# Patient Record
Sex: Male | Born: 1970 | Race: Black or African American | Hispanic: No | Marital: Single
Health system: Southern US, Community
[De-identification: ages and names within clinical notes are randomized; demographics above are authoritative.]

## PROBLEM LIST (undated history)

## (undated) ENCOUNTER — Emergency Department (HOSPITAL_COMMUNITY): Admission: EM | Payer: Managed Care, Other (non HMO)

## (undated) DIAGNOSIS — J45909 Unspecified asthma, uncomplicated: Secondary | ICD-10-CM

## (undated) DIAGNOSIS — I10 Essential (primary) hypertension: Secondary | ICD-10-CM

## (undated) DIAGNOSIS — J939 Pneumothorax, unspecified: Secondary | ICD-10-CM

## (undated) HISTORY — DX: Pneumothorax, unspecified: J93.9

## (undated) HISTORY — PX: WISDOM TOOTH EXTRACTION: SHX21

## (undated) HISTORY — PX: OTHER SURGICAL HISTORY: SHX169

## (undated) HISTORY — DX: Essential (primary) hypertension: I10

## (undated) HISTORY — DX: Unspecified asthma, uncomplicated: J45.909

---

## 2006-12-22 ENCOUNTER — Emergency Department (HOSPITAL_COMMUNITY): Admission: EM | Admit: 2006-12-22 | Discharge: 2006-12-22 | Payer: Self-pay | Admitting: Emergency Medicine

## 2008-04-16 IMAGING — CR DG HAND COMPLETE 3+V*R*
3 series · 3 of 3 positions shown · non-contrast
Comparison: none

CLINICAL DATA: Knuckle laceration. 
RIGHT HAND ? 3 VIEW:

[x hand pa right]
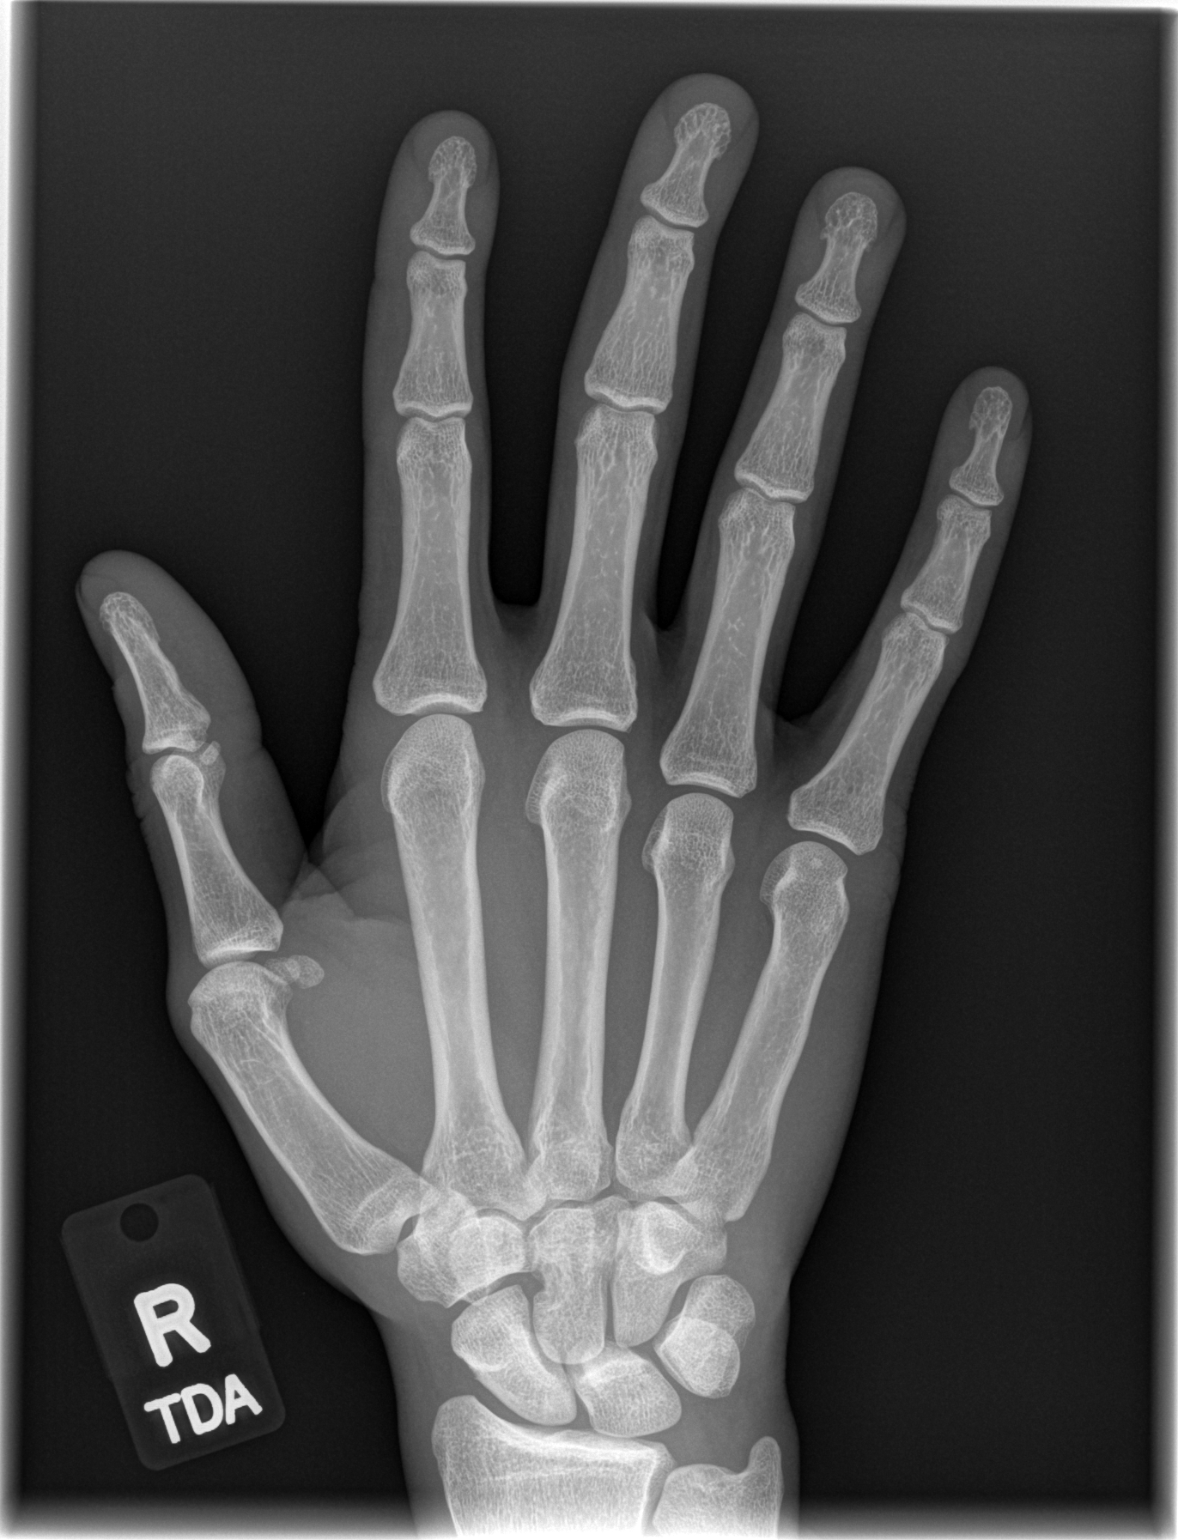

[x hand oblique right]
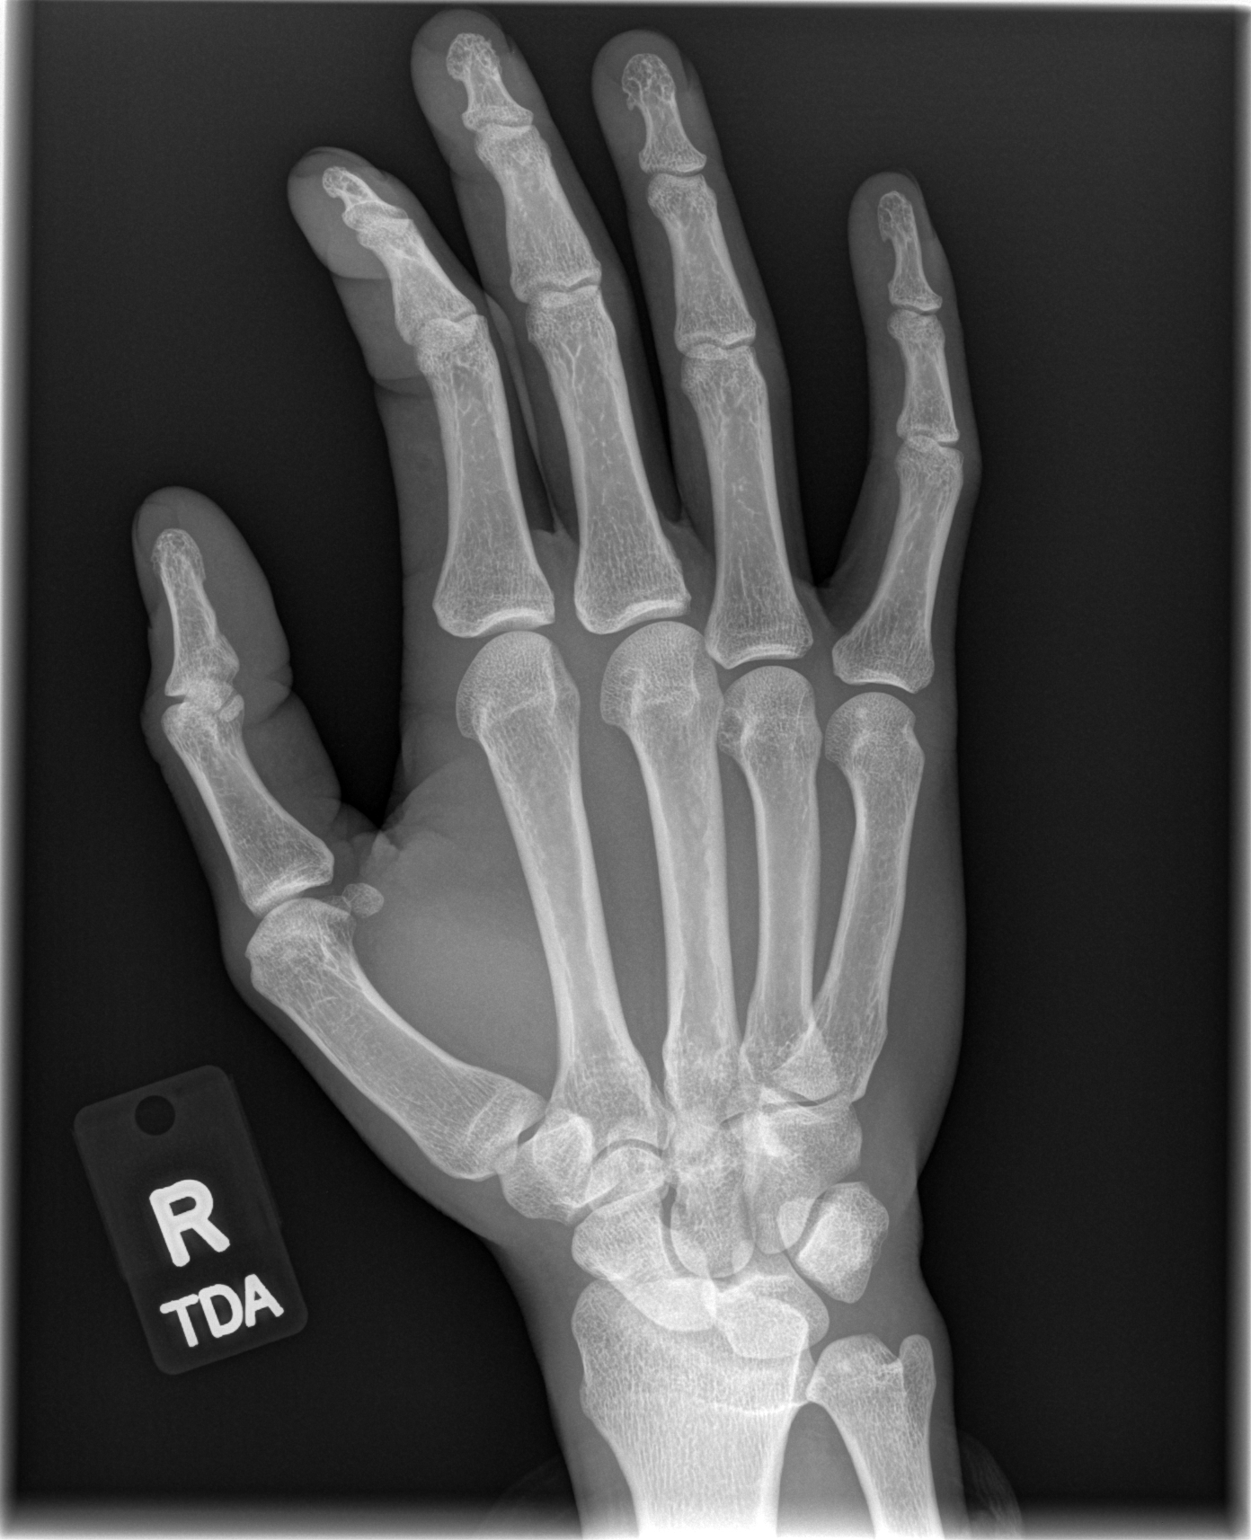

[x hand lat right]
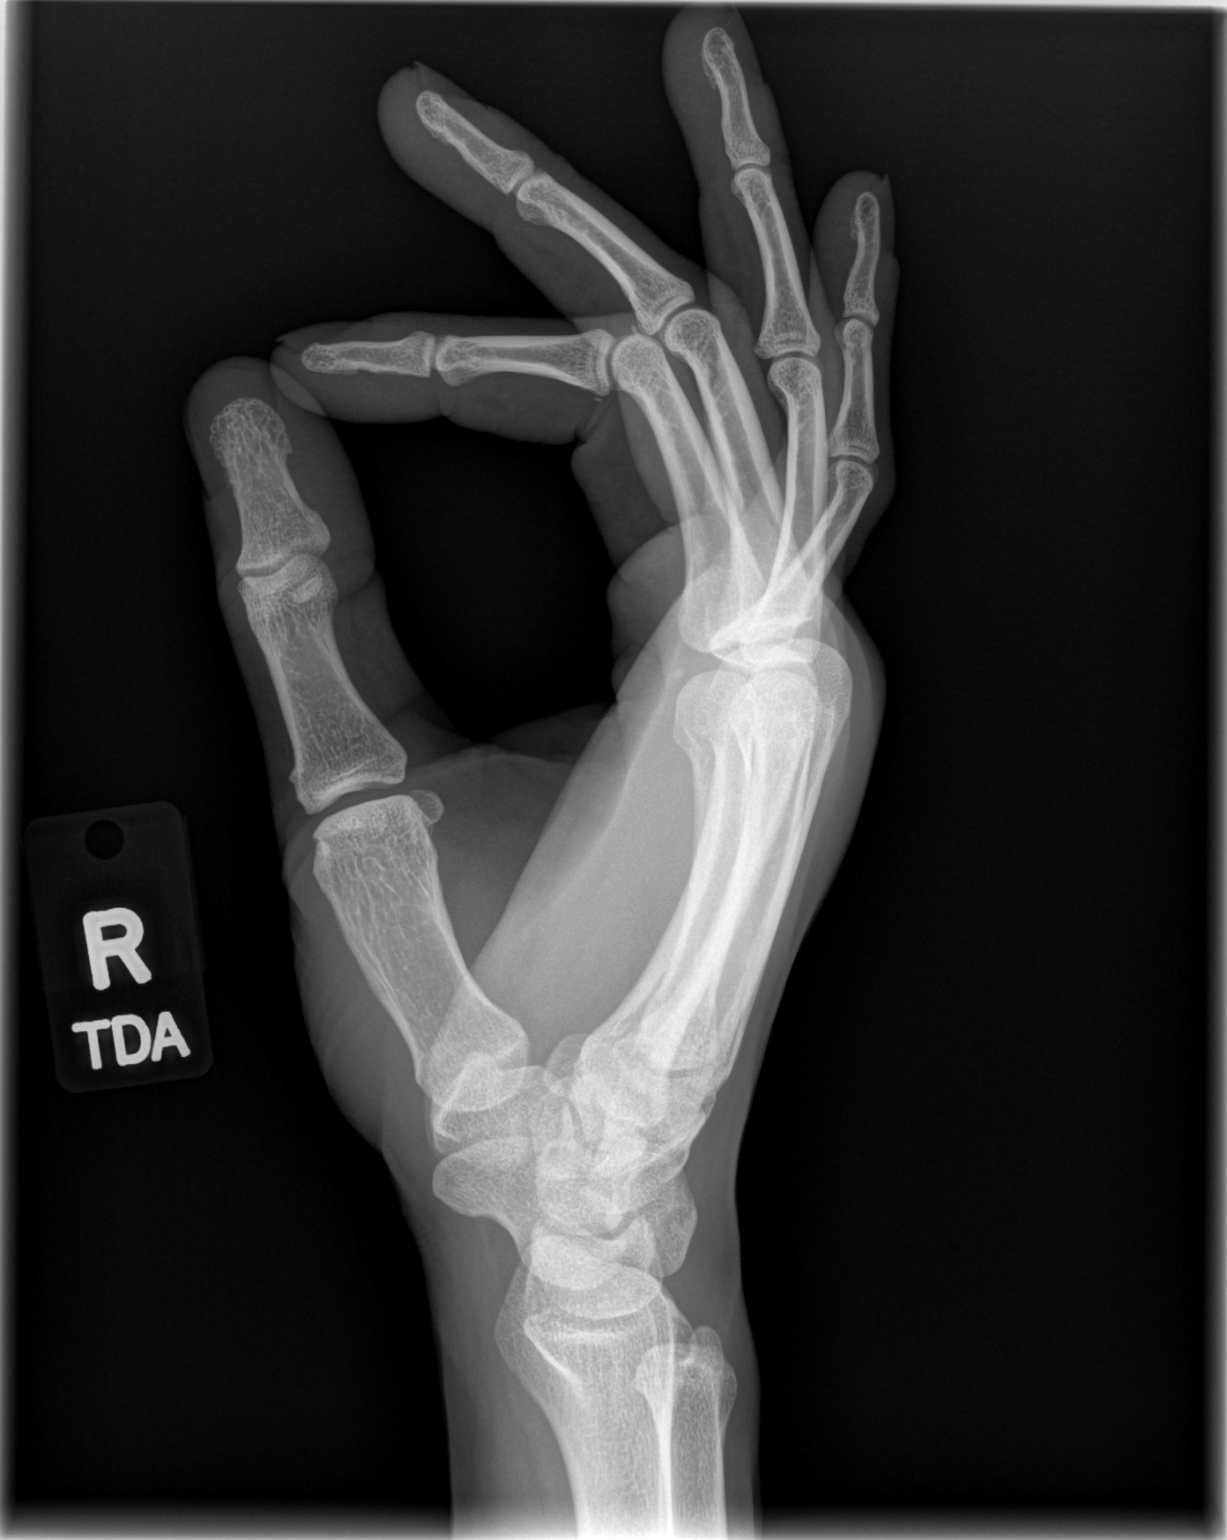

[3 of 3 positions shown; findings below may reference images not displayed]

FINDINGS: Standard 3 views of the right hand were obtained and 2 additional lateral views of the right index finger were obtained.  On the lateral view of the hand, there are small ossific densities along the volar aspect of middle phalanx involving the index and middle fingers.  These are probably small avulsion injuries and these may be chronic in nature.  There is no evidence to suggest acute fracture or dislocation in the area of concern which is the MCP joints.  Carpal bones appear intact.
IMPRESSION: 1.  No acute bone abnormalities of the right hand. 
2.  Suspect avulsion injuries involving the index finger and middle finger PIP joints.  These are age indeterminate.

## 2010-05-16 ENCOUNTER — Inpatient Hospital Stay (INDEPENDENT_AMBULATORY_CARE_PROVIDER_SITE_OTHER)
Admission: RE | Admit: 2010-05-16 | Discharge: 2010-05-16 | Disposition: A | Discharge status: Home / Self Care | Payer: Self-pay | Source: Ambulatory Visit | Attending: Emergency Medicine | Admitting: Emergency Medicine

## 2010-05-16 DIAGNOSIS — B9789 Other viral agents as the cause of diseases classified elsewhere: Secondary | ICD-10-CM

## 2010-05-16 LAB — POCT RAPID STREP A (OFFICE): Streptococcus, Group A Screen (Direct): NEGATIVE

## 2012-07-12 ENCOUNTER — Ambulatory Visit (INDEPENDENT_AMBULATORY_CARE_PROVIDER_SITE_OTHER): Payer: BC Managed Care – PPO | Admitting: Family Medicine

## 2012-07-12 VITALS — BP 136/82 | HR 79 | Temp 98.5°F | Resp 16 | Ht 73.0 in | Wt 179.0 lb

## 2012-07-12 DIAGNOSIS — Z113 Encounter for screening for infections with a predominantly sexual mode of transmission: Secondary | ICD-10-CM

## 2012-07-12 LAB — RPR

## 2012-07-12 NOTE — Progress Notes (Signed)
[facility] [address] [place] [address] [phone]  Date:  07/12/2012   Name:  John Wang   DOB:  [birth year]   MRN:  [id number]  PCP:  No primary provider on file.    Chief Complaint: Exposure to STD   History of Present Illness:  John Wang is a 42 y.o. very pleasant male patient who presents with the following:  Here today with his male parther- he is concerned because she noted some vaginal spotting today, and he now noted a "raw" feeling in his groin and penis.  No known STI exposure, no penile discharge.  He is otherwise healthy.  He has not had any STI testing since they started their relationship about 6 months ago.    He did sit at the hospital for several hours 2 days ago when she was being treated for a headache and hypertension.  He thinks he might have gotten overheated  There are no active problems to display for this patient.   No past medical history on file.  No past surgical history on file.  History  Substance Use Topics  . Smoking status: Current Every Day Smoker -- 0.50 packs/day    Types: Cigarettes  . Smokeless tobacco: Not on file  . Alcohol Use: Yes    No family history on file.  No Known Allergies  Medication list has been reviewed and updated.  No current outpatient prescriptions on file prior to visit.   No current facility-administered medications on file prior to visit.    Review of Systems:  As per HPI- otherwise negative.   Physical Examination: Filed Vitals:   07/12/12 1435  BP: 136/82  Pulse: 79  Temp: 98.5 F (36.9 C)  Resp: 16   Filed Vitals:   07/12/12 1435  Height: 6\' 1"  (1.854 m)  Weight: 179 lb (81.194 kg)   Body mass index is 23.62 kg/(m^2). Ideal Body Weight: Weight in (lb) to have BMI = 25: 189.1  GEN: WDWN, NAD, Non-toxic, A & O x 3, looks well HEENT: Atraumatic, Normocephalic. Neck supple. No masses, No LAD.  No oral lesions Ears and Nose: No external  deformity. CV: RRR, No M/G/R. No JVD. No thrill. No extra heart sounds. PULM: CTA B, no wheezes, crackles, rhonchi. No retractions. No resp. distress. No accessory muscle use. ABD: S, NT, ND. No rebound. No HSM. EXTR: No c/c/e NEURO Normal gait.  PSYCH: Normally interactive. Conversant. Not depressed or anxious appearing.  Calm demeanor.  GU: no rash or lesion, no apparent penile discharge, no scrotal or testicular abnormality or tenderness. No inguinal hernia.   Assessment and Plan: Routine screening for STI (sexually transmitted infection) - Plan: GC/Chlamydia Probe Amp, HIV antibody, RPR, Hepatitis B surface antigen, Hepatitis C antibody, HSV(herpes simplex vrs) 1+2 ab-IgG   genprobe pending.  No abnormality noted.  He thinks he may have gotten some irritation in his groin due to overheating.  Will contract with labs, if any problem in the meantime he will call or RTC  Signed Abbe Amsterdam, MD

## 2012-07-12 NOTE — Patient Instructions (Signed)
I will be in touch with your labs.  Let me know if you have any other problems or sx in the meantime.

## 2012-07-13 LAB — HSV(HERPES SIMPLEX VRS) I + II AB-IGG
HSV 1 Glycoprotein G Ab, IgG: 0.74 IV
HSV 2 Glycoprotein G Ab, IgG: 4.04 IV — ABNORMAL HIGH

## 2012-07-18 ENCOUNTER — Encounter: Payer: Self-pay | Admitting: Family Medicine

## 2012-07-18 ENCOUNTER — Telehealth: Payer: Self-pay | Admitting: Family Medicine

## 2012-07-18 NOTE — Telephone Encounter (Signed)
Called to go over labs.  He is feeling fine.  His sx have resolved.  Went over his labs- he is positive for HSV 2 antibodies.  No definite histofy of outbreak.  Discussed suppressive therapy to help decrease the risk of speaking this virus and outbreaks.  However, at this time he is not sure he wants to use suppression.   He will think about this and let me know Will mail a copy to him

## 2012-08-17 ENCOUNTER — Telehealth: Payer: Self-pay

## 2012-08-17 NOTE — Telephone Encounter (Signed)
Pt is calling to see if Dr Patsy Lager has some type of remedies or suggestion for his back pain he is having. Call back number is [phone]

## 2012-08-17 NOTE — Telephone Encounter (Signed)
Spoke with patient and he stated that he has a muscle spasm in his back.  Told him that since he had not been seen here for this problem, that he would need to come in for a evaluation.  Patient stated that he understood and would come in if gets worse.

## 2013-02-06 ENCOUNTER — Other Ambulatory Visit: Payer: Self-pay

## 2013-02-06 ENCOUNTER — Other Ambulatory Visit: Payer: Self-pay | Admitting: Family Medicine

## 2013-02-06 DIAGNOSIS — R079 Chest pain, unspecified: Secondary | ICD-10-CM

## 2014-03-11 ENCOUNTER — Ambulatory Visit (INDEPENDENT_AMBULATORY_CARE_PROVIDER_SITE_OTHER): Payer: BLUE CROSS/BLUE SHIELD | Admitting: Family Medicine

## 2014-03-11 VITALS — BP 122/74 | HR 90 | Temp 98.2°F | Resp 18 | Ht 73.0 in | Wt 164.0 lb

## 2014-03-11 DIAGNOSIS — Z720 Tobacco use: Secondary | ICD-10-CM

## 2014-03-11 DIAGNOSIS — B009 Herpesviral infection, unspecified: Secondary | ICD-10-CM

## 2014-03-11 DIAGNOSIS — Z0000 Encounter for general adult medical examination without abnormal findings: Secondary | ICD-10-CM

## 2014-03-11 DIAGNOSIS — F17200 Nicotine dependence, unspecified, uncomplicated: Secondary | ICD-10-CM

## 2014-03-11 DIAGNOSIS — L729 Follicular cyst of the skin and subcutaneous tissue, unspecified: Secondary | ICD-10-CM

## 2014-03-11 DIAGNOSIS — D1720 Benign lipomatous neoplasm of skin and subcutaneous tissue of unspecified limb: Secondary | ICD-10-CM

## 2014-03-11 DIAGNOSIS — Z202 Contact with and (suspected) exposure to infections with a predominantly sexual mode of transmission: Secondary | ICD-10-CM

## 2014-03-11 LAB — POCT CBC
Granulocyte percent: 59.8 %G (ref 37–80)
HCT, POC: 42.6 % — AB (ref 43.5–53.7)
Hemoglobin: 13.5 g/dL — AB (ref 14.1–18.1)
Lymph, poc: 3.3 (ref 0.6–3.4)
MCH, POC: 29.6 pg (ref 27–31.2)
MCHC: 31.7 g/dL — AB (ref 31.8–35.4)
MCV: 93.3 fL (ref 80–97)
MID (cbc): 0.2 (ref 0–0.9)
MPV: 7.8 fL (ref 0–99.8)
POC Granulocyte: 5.2 (ref 2–6.9)
POC LYMPH PERCENT: 37.8 %L (ref 10–50)
POC MID %: 2.4 %M (ref 0–12)
Platelet Count, POC: 284 10*3/uL (ref 142–424)
RBC: 4.57 M/uL — AB (ref 4.69–6.13)
RDW, POC: 14 %
WBC: 8.7 10*3/uL (ref 4.6–10.2)

## 2014-03-11 LAB — POCT GLYCOSYLATED HEMOGLOBIN (HGB A1C): Hemoglobin A1C: 5.7

## 2014-03-11 NOTE — Patient Instructions (Signed)
Work on tobacco quitting as we discussed.  Return if the lipoma seems to be growing  Return yearly or as needed  We will let you know the results of your laboratory testing when everything is back.

## 2014-03-11 NOTE — Progress Notes (Signed)
Physical exam:  History: 44 year old man who is here for an annual physical examination. He has a physical exam as part of his job benefits, and therefore he would like this done. He would also like STD testing done while he was here. He has several other little concerned, nothing major.  Past medical history: Medications: None Allergies: None Past medical history: Unremarkable except for a history of herpes. He had some medicine over the years but he does not like taking it so does not want anything. 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 He is divorced. Has 2 children whom he still maintains relationship with. He is sexually involved, about 4 partners in the last year. One regular partner that he does not use any protection with, uses condoms on the others. Smokes one half pack syrup today. Drinks about 3 mixed drinks on weekends. No longer uses any drugs. Has a college education  Review of systems: Constitutional: Unremarkable HEENT: Unremarkable Respiratory: Unremarkable Cardiac breast are: Unremarkable Gastrointestinal: Unremarkable Endocrine: Unremarkable Genitourinary: Unremarkable Musculoskeletal: Has a little knot on his right upper arm he wants me to check Dermatologic: Has a old cyst on the left side of his neck Allergies: Seasonal allergies Neurological: Unremarkable Hematologic: Unremarkable Psychiatric: Unremarkable   Physical examination: Lean, well-developed man in no acute distress. Old acneiform scarring on his face. TMs normal. Eyes PERRLA. EOMs intact. Throat fundi benign. Throat clear. Neck supple without nodes or thyromegaly. No carotid bruits. He has a old cyst where he had a boil on the left side of his neck, about 1.5 cm in diameter,  scarred. His chest is clear to auscultation. Heart regular without murmurs gallops or arrhythmias. Abdomen is soft without organomegaly mass or tenderness. Normal male external genitalia with testes descended. No hernias. Has an old seborrheic keratosis on the dorsum of his shaft of the penis which is been there many years. Extremities unremarkable except for on the right arm lateral biceps area there is a 2.5 cm subcutaneous circumscribed mass that feels like a moderately firm lipoma. It's readily movable.  Assessment: Normal physical examination Cyst left neck Lipoma right arm History of HSV Risk of STDs Tobacco use disorder  Plan: Had a good talk with him about stopping smoking. He gets a lot of exercise with his job encouraging some basketball. He will just follow that place on his arm, if it changes he will let us know. Offered him going to a Psychologist, sport and exercise and getting it excised but he declined at this time. I do not see any need urgency to that  Results for orders placed or performed in visit on 03/11/14  POCT CBC  Result Value Ref Range   WBC 8.7 4.6 - 10.2 K/uL   Lymph, poc 3.3 0.6 - 3.4   POC LYMPH PERCENT 37.8 10 - 50 %L   MID (cbc) 0.2 0 - 0.9   POC MID % 2.4 0 - 12 %M   POC Granulocyte 5.2 2 - 6.9   Granulocyte percent 59.8 37 - 80 %G   RBC 4.57 (A) 4.69 - 6.13 M/uL   Hemoglobin 13.5 (A) 14.1 - 18.1 g/dL   HCT, POC 42.6 (A) 43.5 - 53.7 %   MCV 93.3 80 - 97 fL   MCH, POC 29.6 27 - 31.2 pg  MCHC 31.7 (A) 31.8 - 35.4 g/dL   RDW, POC 14.0 %   Platelet Count, POC 284 142 - 424 K/uL   MPV 7.8 0 - 99.8 fL  POCT glycosylated hemoglobin (Hb A1C)  Result Value Ref Range   Hemoglobin A1C 5.7

## 2014-03-12 LAB — COMPLETE METABOLIC PANEL WITH GFR
ALK PHOS: 89 U/L (ref 39–117)
AST: 14 U/L (ref 0–37)
Albumin: 4.4 g/dL (ref 3.5–5.2)
BILIRUBIN TOTAL: 0.3 mg/dL (ref 0.2–1.2)
BUN: 13 mg/dL (ref 6–23)
CO2: 29 meq/L (ref 19–32)
CREATININE: 1.09 mg/dL (ref 0.50–1.35)
Calcium: 9.6 mg/dL (ref 8.4–10.5)
Chloride: 100 mEq/L (ref 96–112)
GFR, Est Non African American: 83 mL/min
Glucose, Bld: 95 mg/dL (ref 70–99)
Potassium: 4.5 mEq/L (ref 3.5–5.3)
SODIUM: 138 meq/L (ref 135–145)
Total Protein: 7.7 g/dL (ref 6.0–8.3)

## 2014-03-12 LAB — LIPID PANEL
CHOL/HDL RATIO: 3.5 ratio
Cholesterol: 163 mg/dL (ref 0–200)
HDL: 47 mg/dL (ref >39)
LDL Cholesterol: 97 mg/dL (ref 0–99)
TRIGLYCERIDES: 94 mg/dL (ref <150)
VLDL: 19 mg/dL (ref 0–40)

## 2014-03-12 LAB — HIV ANTIBODY (ROUTINE TESTING W REFLEX): HIV 1&2 Ab, 4th Generation: NONREACTIVE

## 2014-03-12 LAB — TSH: TSH: 0.623 u[IU]/mL (ref 0.350–4.500)

## 2014-03-12 LAB — RPR

## 2014-03-13 LAB — GC/CHLAMYDIA PROBE AMP
CT PROBE, AMP APTIMA: NEGATIVE
GC PROBE AMP APTIMA: NEGATIVE

## 2016-04-28 ENCOUNTER — Ambulatory Visit (INDEPENDENT_AMBULATORY_CARE_PROVIDER_SITE_OTHER): Payer: BLUE CROSS/BLUE SHIELD | Admitting: Emergency Medicine

## 2016-04-28 VITALS — BP 133/88 | HR 80 | Temp 98.7°F | Ht 73.0 in | Wt 177.0 lb

## 2016-04-28 DIAGNOSIS — M791 Myalgia, unspecified site: Secondary | ICD-10-CM | POA: Insufficient documentation

## 2016-04-28 DIAGNOSIS — J069 Acute upper respiratory infection, unspecified: Secondary | ICD-10-CM | POA: Diagnosis not present

## 2016-04-28 DIAGNOSIS — R6889 Other general symptoms and signs: Secondary | ICD-10-CM | POA: Diagnosis not present

## 2016-04-28 DIAGNOSIS — J029 Acute pharyngitis, unspecified: Secondary | ICD-10-CM | POA: Insufficient documentation

## 2016-04-28 LAB — POCT INFLUENZA A/B
INFLUENZA A, POC: NEGATIVE
INFLUENZA B, POC: NEGATIVE

## 2016-04-28 MED ORDER — HYDROCODONE-ACETAMINOPHEN 5-325 MG PO TABS: 1.0000 | ORAL_TABLET | Freq: Four times a day (QID) | ORAL | 0 refills | Status: DC | PRN

## 2016-04-28 MED ORDER — AZITHROMYCIN 250 MG PO TABS: ORAL_TABLET | ORAL | 0 refills | Status: DC

## 2016-04-28 NOTE — Patient Instructions (Addendum)
     IF you received an x-ray today, you will receive an invoice from [facility]. Please contact [facility] at [phone] with questions or concerns regarding your invoice.   IF you received labwork today, you will receive an invoice from [facility]. Please contact [facility] at [phone] with questions or concerns regarding your invoice.   Our billing staff will not be able to assist you with questions regarding bills from these companies.  You will be contacted with the lab results as soon as they are available. The fastest way to get your results is to activate your My Chart account. Instructions are located on the last page of this paperwork. If you have not heard from us regarding the results in 2 weeks, please contact this office.      Upper Respiratory Infection, Adult Most upper respiratory infections (URIs) are caused by a virus. A URI affects the nose, throat, and upper air passages. The most common type of URI is often called "the common cold." Follow these instructions at home:  Take medicines only as told by your doctor.  Gargle warm saltwater or take cough drops to comfort your throat as told by your doctor.  Use a warm mist humidifier or inhale steam from a shower to increase air moisture. This may make it easier to breathe.  Drink enough fluid to keep your pee (urine) clear or pale yellow.  Eat soups and other clear broths.  Have a healthy diet.  Rest as needed.  Go back to work when your fever is gone or your doctor says it is okay.  You may need to stay home longer to avoid giving your URI to others.  You can also wear a face mask and wash your hands often to prevent spread of the virus.  Use your inhaler more if you have asthma.  Do not use any tobacco products, including cigarettes, chewing tobacco, or electronic cigarettes. If you need help quitting, ask your doctor. Contact a doctor if:  You are getting worse, not better.  Your  symptoms are not helped by medicine.  You have chills.  You are getting more short of breath.  You have brown or red mucus.  You have yellow or brown discharge from your nose.  You have pain in your face, especially when you bend forward.  You have a fever.  You have puffy (swollen) neck glands.  You have pain while swallowing.  You have white areas in the back of your throat. Get help right away if:  You have very bad or constant:  Headache.  Ear pain.  Pain in your forehead, behind your eyes, and over your cheekbones (sinus pain).  Chest pain.  You have long-lasting (chronic) lung disease and any of the following:  Wheezing.  Long-lasting cough.  Coughing up blood.  A change in your usual mucus.  You have a stiff neck.  You have changes in your:  Vision.  Hearing.  Thinking.  Mood. This information is not intended to replace advice given to you by your health care provider. Make sure you discuss any questions you have with your health care provider. Document Released: 06/30/2007 Document Revised: 09/14/2015 Document Reviewed: 04/18/2013 Elsevier Interactive Patient Education  2017 Elsevier Inc.  

## 2016-04-28 NOTE — Progress Notes (Addendum)
John Wang 46 y.o.   Chief Complaint  Patient presents with  . Sore Throat    X 4 days   . Generalized Body Aches     X 1 day with Chills   . Nasal Congestion    X  4 days with ear pain    HISTORY OF PRESENT ILLNESS: This is a 46 y.o. male complaining of flu-like symptoms x 4 days.  Sore Throat   This is a new problem. The current episode started in the past 7 days. The problem has been gradually worsening. There has been no fever. The pain is at a severity of 5/10. The pain is moderate. Associated symptoms include congestion, coughing, ear pain, headaches and neck pain. Pertinent negatives include no abdominal pain, diarrhea, ear discharge, shortness of breath, swollen glands or vomiting. Associated symptoms comments: +chills and fatigue.     Prior to Admission medications   Not on File    No Known Allergies  There are no active problems to display for this patient.   Past Medical History:  Diagnosis Date  . Asthma     History reviewed. No pertinent surgical history.  Social History   Social History  . Marital status: Single    Spouse name: N/A  . Number of children: N/A  . Years of education: N/A   Occupational History  . Not on file.   Social History Main Topics  . Smoking status: Current Every Day Smoker    Packs/day: 0.50    Types: Cigarettes  . Smokeless tobacco: Never Used  . Alcohol use 1.8 oz/week    3 Shots of liquor per week  . Drug use: No  . Sexual activity: Yes   Other Topics Concern  . Not on file   Social History Narrative  . No narrative on file    History reviewed. No pertinent family history.   Review of Systems  Constitutional: Positive for chills and malaise/fatigue. Negative for fever.  HENT: Positive for congestion, ear pain and sore throat. Negative for ear discharge, nosebleeds and sinus pain.   Eyes: Negative.   Respiratory: Positive for cough. Negative for sputum production and shortness of breath.     Cardiovascular: Negative for chest pain, palpitations and leg swelling.  Gastrointestinal: Negative for abdominal pain, diarrhea, nausea and vomiting.  Genitourinary: Negative for dysuria and hematuria.  Musculoskeletal: Positive for neck pain.  Skin: Negative for rash.  Neurological: Positive for headaches. Negative for dizziness, sensory change and focal weakness.  All other systems reviewed and are negative.  Vitals:   04/28/16 1700  BP: 133/88  Pulse: 80  Temp: 98.7 F (37.1 C)     Physical Exam  Constitutional: He is oriented to person, place, and time. He appears well-developed and well-nourished.  HENT:  Head: Normocephalic and atraumatic.  Right Ear: Tympanic membrane and external ear normal.  Left Ear: Tympanic membrane and external ear normal.  Nose: Nose normal.  Mouth/Throat: Posterior oropharyngeal erythema present. No oropharyngeal exudate.  Eyes: Conjunctivae and EOM are normal. Pupils are equal, round, and reactive to light.  Neck: Normal range of motion. Neck supple. No JVD present. No thyromegaly present.  Cardiovascular: Normal rate, regular rhythm, normal heart sounds and intact distal pulses.   Pulmonary/Chest: Effort normal and breath sounds normal.  Abdominal: Soft. Bowel sounds are normal. There is no tenderness.  Musculoskeletal: Normal range of motion.  Lymphadenopathy:    He has no cervical adenopathy.  Neurological: He is alert and oriented to person,  place, and time. No sensory deficit. He exhibits normal muscle tone.  Skin: Skin is warm and dry. Capillary refill takes less than 2 seconds. No rash noted.  Psychiatric: He has a normal mood and affect. His behavior is normal.  Vitals reviewed.    ASSESSMENT & PLAN: John Wang was seen today for sore throat, generalized body aches and nasal congestion.  Diagnoses and all orders for this visit:  Acute upper respiratory infection  Flu-like symptoms -     POCT Influenza A/B  Acute pharyngitis,  unspecified etiology  Generalized muscle ache  Other orders -     azithromycin (ZITHROMAX) 250 MG tablet; Sig as indicated -     HYDROcodone-acetaminophen (NORCO) 5-325 MG tablet; Take 1 tablet by mouth every 6 (six) hours as needed for moderate pain.     Patient Instructions       IF you received an x-ray today, you will receive an invoice from [facility]. Please contact [facility] at [phone] with questions or concerns regarding your invoice.   IF you received labwork today, you will receive an invoice from [facility]. Please contact [facility] at [phone] with questions or concerns regarding your invoice.   Our billing staff will not be able to assist you with questions regarding bills from these companies.  You will be contacted with the lab results as soon as they are available. The fastest way to get your results is to activate your My Chart account. Instructions are located on the last page of this paperwork. If you have not heard from [place] regarding the results in 2 weeks, please contact this office.      Upper Respiratory Infection, Adult Most upper respiratory infections (URIs) are caused by a virus. A URI affects the nose, throat, and upper air passages. The most common type of URI is often called "the common cold." Follow these instructions at home:  Take medicines only as told by your doctor.  Gargle warm saltwater or take cough drops to comfort your throat as told by your doctor.  Use a warm mist humidifier or inhale steam from a shower to increase air moisture. This may make it easier to breathe.  Drink enough fluid to keep your pee (urine) clear or pale yellow.  Eat soups and other clear broths.  Have a healthy diet.  Rest as needed.  Go back to work when your fever is gone or your doctor says it is okay.  You may need to stay home longer to avoid giving your URI to others.  You can also wear a face mask and wash your hands often  to prevent spread of the virus.  Use your inhaler more if you have asthma.  Do not use any tobacco products, including cigarettes, chewing tobacco, or electronic cigarettes. If you need help quitting, ask your doctor. Contact a doctor if:  You are getting worse, not better.  Your symptoms are not helped by medicine.  You have chills.  You are getting more short of breath.  You have brown or red mucus.  You have yellow or brown discharge from your nose.  You have pain in your face, especially when you bend forward.  You have a fever.  You have puffy (swollen) neck glands.  You have pain while swallowing.  You have white areas in the back of your throat. Get help right away if:  You have very bad or constant:  Headache.  Ear pain.  Pain in your forehead, behind your eyes, and over  your cheekbones (sinus pain).  Chest pain.  You have long-lasting (chronic) lung disease and any of the following:  Wheezing.  Long-lasting cough.  Coughing up blood.  A change in your usual mucus.  You have a stiff neck.  You have changes in your:  Vision.  Hearing.  Thinking.  Mood. This information is not intended to replace advice given to you by your health care provider. Make sure you discuss any questions you have with your health care provider. Document Released: 06/30/2007 Document Revised: 09/14/2015 Document Reviewed: 04/18/2013 Elsevier Interactive Patient Education  2017 Elsevier Inc.      Agustina Caroli, MD [facility]

## 2016-04-28 NOTE — Addendum Note (Signed)
Addended by: Davina Poke on: 04/28/2016 05:55 PM   Modules accepted: Orders

## 2016-06-16 ENCOUNTER — Encounter: Payer: Self-pay | Admitting: Emergency Medicine

## 2016-06-16 ENCOUNTER — Ambulatory Visit (INDEPENDENT_AMBULATORY_CARE_PROVIDER_SITE_OTHER): Payer: BLUE CROSS/BLUE SHIELD | Admitting: Emergency Medicine

## 2016-06-16 VITALS — BP 133/92 | HR 86 | Temp 98.8°F | Resp 18 | Ht 74.0 in | Wt 175.0 lb

## 2016-06-16 DIAGNOSIS — Z0000 Encounter for general adult medical examination without abnormal findings: Secondary | ICD-10-CM | POA: Diagnosis not present

## 2016-06-16 MED ORDER — AZITHROMYCIN 1 G PO PACK: 1.0000 g | PACK | Freq: Once | ORAL | 0 refills | Status: AC

## 2016-06-16 NOTE — Progress Notes (Signed)
John Wang 46 y.o.   Chief Complaint  Patient presents with  . Annual Exam    HISTORY OF PRESENT ILLNESS: This is a 46 y.o. male here for annual exam.  HPI   Prior to Admission medications   Medication Sig Start Date End Date Taking? Authorizing Provider  azithromycin (ZITHROMAX) 250 MG tablet Sig as indicated Patient not taking: Reported on 06/16/2016 04/28/16   Horald Pollen, MD  HYDROcodone-acetaminophen [facility]) 5-325 MG tablet Take 1 tablet by mouth every 6 (six) hours as needed for moderate pain. Patient not taking: Reported on 06/16/2016 04/28/16   Horald Pollen, MD    No Known Allergies  Patient Active Problem List   Diagnosis Date Noted  . Generalized muscle ache 04/28/2016  . Acute pharyngitis 04/28/2016  . Acute upper respiratory infection 04/28/2016  . Flu-like symptoms 04/28/2016    Past Medical History:  Diagnosis Date  . Asthma     No past surgical history on file.  Social History   Social History  . Marital status: Single    Spouse name: N/A  . Number of children: N/A  . Years of education: N/A   Occupational History  . Not on file.   Social History Main Topics  . Smoking status: Current Every Day Smoker    Packs/day: 0.50    Types: Cigarettes  . Smokeless tobacco: Never Used  . Alcohol use 1.8 oz/week    3 Shots of liquor per week  . Drug use: No  . Sexual activity: Yes   Other Topics Concern  . Not on file   Social History Narrative  . No narrative on file    No family history on file.   Review of Systems  Constitutional: Negative.  Negative for chills and fever.  HENT: Negative.  Negative for sore throat.   Eyes: Negative.  Negative for discharge and redness.  Respiratory: Negative.  Negative for cough and shortness of breath.   Cardiovascular: Negative.  Negative for chest pain, palpitations, claudication and leg swelling.  Gastrointestinal: Negative.  Negative for abdominal pain, diarrhea, nausea and  vomiting.  Genitourinary: Negative for dysuria, frequency and hematuria.       Intermittent penile discharge x 1 week  Musculoskeletal: Negative.   Skin: Negative.  Negative for rash.  Neurological: Negative.  Negative for dizziness and headaches.  Endo/Heme/Allergies: Negative.   All other systems reviewed and are negative.  Vitals:   06/16/16 1627  BP: (!) 133/92  Pulse: 86  Resp: 18  Temp: 98.8 F (37.1 C)     Physical Exam  Constitutional: He is oriented to person, place, and time. He appears well-developed and well-nourished.  HENT:  Head: Normocephalic and atraumatic.  Right Ear: External ear normal.  Left Ear: External ear normal.  Nose: Nose normal.  Mouth/Throat: Oropharynx is clear and moist. No oropharyngeal exudate.  Eyes: Conjunctivae and EOM are normal. Pupils are equal, round, and reactive to light.  Neck: Normal range of motion. Neck supple. No JVD present. No thyromegaly present.  Cardiovascular: Normal rate, regular rhythm, normal heart sounds and intact distal pulses.   Pulmonary/Chest: Effort normal and breath sounds normal.  Abdominal: Soft. Bowel sounds are normal. He exhibits no distension. There is no tenderness.  Musculoskeletal: Normal range of motion.  Lymphadenopathy:    He has no cervical adenopathy.  Neurological: He is oriented to person, place, and time. No sensory deficit. He exhibits normal muscle tone.  Skin: Skin is warm and dry. Capillary refill takes  less than 2 seconds. No rash noted.  Psychiatric: He has a normal mood and affect. His behavior is normal.  Vitals reviewed.    ASSESSMENT & PLAN: John Wang was seen today for annual exam.  Diagnoses and all orders for this visit:  Routine general medical examination at a health care facility -     CBC with Differential -     Comprehensive metabolic panel -     Hemoglobin A1c -     Lipid panel -     TSH -     Urinalysis -     GC/Chlamydia Probe Amp -     HIV antibody (with  reflex)  Other orders -     azithromycin (ZITHROMAX) 1 g powder; Take 1 packet (1 g total) by mouth once.    Patient Instructions       IF you received an x-ray today, you will receive an invoice from [facility]. Please contact [facility] at [phone] with questions or concerns regarding your invoice.   IF you received labwork today, you will receive an invoice from [facility]. Please contact [facility] at [phone] with questions or concerns regarding your invoice.   Our billing staff will not be able to assist you with questions regarding bills from these companies.  You will be contacted with the lab results as soon as they are available. The fastest way to get your results is to activate your My Chart account. Instructions are located on the last page of this paperwork. If you have not heard from [place] regarding the results in 2 weeks, please contact this office.        Health Maintenance, Male A healthy lifestyle and preventive care is important for your health and wellness. Ask your health care provider about what schedule of regular examinations is right for you. What should I know about weight and diet?  Eat a Healthy Diet  Eat plenty of vegetables, fruits, whole grains, low-fat dairy products, and lean protein.  Do not eat a lot of foods high in solid fats, added sugars, or salt. Maintain a Healthy Weight  Regular exercise can help you achieve or maintain a healthy weight. You should:  Do at least 150 minutes of exercise each week. The exercise should increase your heart rate and make you sweat (moderate-intensity exercise).  Do strength-training exercises at least twice a week. Watch Your Levels of Cholesterol and Blood Lipids  Have your blood tested for lipids and cholesterol every 5 years starting at 44 years of age. If you are at high risk for heart disease, you should start having your blood tested when you are 44 years old. You may need to  have your cholesterol levels checked more often if:  Your lipid or cholesterol levels are high.  You are older than 44 years of age.  You are at high risk for heart disease. What should I know about cancer screening? Many types of cancers can be detected early and may often be prevented. Lung Cancer  You should be screened every year for lung cancer if:  You are a current smoker who has smoked for at least 30 years.  You are a former smoker who has quit within the past 15 years.  Talk to your health care provider about your screening options, when you should start screening, and how often you should be screened. Colorectal Cancer  Routine colorectal cancer screening usually begins at 44 years of age and should be repeated every 5-10 years until you  are 44 years old. You may need to be screened more often if early forms of precancerous polyps or small growths are found. Your health care provider may recommend screening at an earlier age if you have risk factors for colon cancer.  Your health care provider may recommend using home test kits to check for hidden blood in the stool.  A small camera at the end of a tube can be used to examine your colon (sigmoidoscopy or colonoscopy). This checks for the earliest forms of colorectal cancer. Prostate and Testicular Cancer  Depending on your age and overall health, your health care provider may do certain tests to screen for prostate and testicular cancer.  Talk to your health care provider about any symptoms or concerns you have about testicular or prostate cancer. Skin Cancer  Check your skin from head to toe regularly.  Tell your health care provider about any new moles or changes in moles, especially if:  There is a change in a mole's size, shape, or color.  You have a mole that is larger than a pencil eraser.  Always use sunscreen. Apply sunscreen liberally and repeat throughout the day.  Protect yourself by wearing long  sleeves, pants, a wide-brimmed hat, and sunglasses when outside. What should I know about heart disease, diabetes, and high blood pressure?  If you are 5-69 years of age, have your blood pressure checked every 3-5 years. If you are 77 years of age or older, have your blood pressure checked every year. You should have your blood pressure measured twice-once when you are at a hospital or clinic, and once when you are not at a hospital or clinic. Record the average of the two measurements. To check your blood pressure when you are not at a hospital or clinic, you can use:  An automated blood pressure machine at a pharmacy.  A home blood pressure monitor.  Talk to your health care provider about your target blood pressure.  If you are between 70-32 years old, ask your health care provider if you should take aspirin to prevent heart disease.  Have regular diabetes screenings by checking your fasting blood sugar level.  If you are at a normal weight and have a low risk for diabetes, have this test once every three years after the age of 110.  If you are overweight and have a high risk for diabetes, consider being tested at a younger age or more often.  A one-time screening for abdominal aortic aneurysm (AAA) by ultrasound is recommended for men aged 44-75 years who are current or former smokers. What should I know about preventing infection? Hepatitis B  If you have a higher risk for hepatitis B, you should be screened for this virus. Talk with your health care provider to find out if you are at risk for hepatitis B infection. Hepatitis C  Blood testing is recommended for:  Everyone born from [birth year] through 1963.  Anyone with known risk factors for hepatitis C. Sexually Transmitted Diseases (STDs)  You should be screened each year for STDs including gonorrhea and chlamydia if:  You are sexually active and are younger than 44 years of age.  You are older than 44 years of age and your health  care provider tells you that you are at risk for this type of infection.  Your sexual activity has changed since you were last screened and you are at an increased risk for chlamydia or gonorrhea. Ask your health care provider if you are  at risk.  Talk with your health care provider about whether you are at high risk of being infected with HIV. Your health care provider may recommend a prescription medicine to help prevent HIV infection. What else can I do?  Schedule regular health, dental, and eye exams.  Stay current with your vaccines (immunizations).  Do not use any tobacco products, such as cigarettes, chewing tobacco, and e-cigarettes. If you need help quitting, ask your health care provider.  Limit alcohol intake to no more than 2 drinks per day. One drink equals 12 ounces of beer, 5 ounces of wine, or 1 ounces of hard liquor.  Do not use street drugs.  Do not share needles.  Ask your health care provider for help if you need support or information about quitting drugs.  Tell your health care provider if you often feel depressed.  Tell your health care provider if you have ever been abused or do not feel safe at home. This information is not intended to replace advice given to you by your health care provider. Make sure you discuss any questions you have with your health care provider. Document Released: 07/10/2007 Document Revised: 09/10/2015 Document Reviewed: 10/15/2014 Elsevier Interactive Patient Education  2017 [facility]) Exercise Recommendation  Being physically active is important to prevent heart disease and stroke, the nation's No. 1and No. 5killers. To improve overall cardiovascular health, we suggest at least 150 minutes per week of moderate exercise or 75 minutes per week of vigorous exercise (or a combination of moderate and vigorous activity). Thirty minutes a day, five times a week is an easy goal to remember. You will also  experience benefits even if you divide your time into two or three segments of 10 to 15 minutes per day.  For people who would benefit from lowering their blood pressure or cholesterol, we recommend 40 minutes of aerobic exercise of moderate to vigorous intensity three to four times a week to lower the risk for heart attack and stroke.  Physical activity is anything that makes you move your body and burn calories.  This includes things like climbing stairs or playing sports. Aerobic exercises benefit your heart, and include walking, jogging, swimming or biking. Strength and stretching exercises are best for overall stamina and flexibility.  The simplest, positive change you can make to effectively improve your heart health is to start walking. It's enjoyable, free, easy, social and great exercise. A walking program is flexible and boasts high success rates because people can stick with it. It's easy for walking to become a regular and satisfying part of life.   For Overall Cardiovascular Health:  At least 30 minutes of moderate-intensity aerobic activity at least 5 days per week for a total of 150  OR   At least 25 minutes of vigorous aerobic activity at least 3 days per week for a total of 75 minutes; or a combination of moderate- and vigorous-intensity aerobic activity  AND   Moderate- to high-intensity muscle-strengthening activity at least 2 days per week for additional health benefits.  For Lowering Blood Pressure and Cholesterol  An average 40 minutes of moderate- to vigorous-intensity aerobic activity 3 or 4 times per week  What if I can't make it to the time goal? Something is always better than nothing! And everyone has to start somewhere. Even if you've been sedentary for years, today is the day you can begin to make healthy changes in your life. If you don't think you'll  make it for 30 or 40 minutes, set a reachable goal for today. You can work up toward your overall goal by  increasing your time as you get stronger. Don't let all-or-nothing thinking rob you of doing what you can every day.  Source:http://www.heart.org    How to Take Your Blood Pressure You can take your blood pressure at home with a machine. You may need to check your blood pressure at home:  To check if you have high blood pressure (hypertension).  To check your blood pressure over time.  To make sure your blood pressure medicine is working. Supplies needed: You will need a blood pressure machine, or monitor. You can buy one at a drugstore or online. When choosing one:  Choose one with an arm cuff.  Choose one that wraps around your upper arm. Only one finger should fit between your arm and the cuff.  Do not choose one that measures your blood pressure from your wrist or finger. Your doctor can suggest a monitor. How to prepare Avoid these things for 30 minutes before checking your blood pressure:  Drinking caffeine.  Drinking alcohol.  Eating.  Smoking.  Exercising. Five minutes before checking your blood pressure:  Pee.  Sit in a dining chair. Avoid sitting in a soft couch or armchair.  Be quiet. Do not talk. How to take your blood pressure Follow the instructions that came with your machine. If you have a digital blood pressure monitor, these may be the instructions: 1. Sit up straight. 2. Place your feet on the floor. Do not cross your ankles or legs. 3. Rest your left arm at the level of your heart. You may rest it on a table, desk, or chair. 4. Pull up your shirt sleeve. 5. Wrap the blood pressure cuff around the upper part of your left arm. The cuff should be 1 inch (2.5 cm) above your elbow. It is best to wrap the cuff around bare skin. 6. Fit the cuff snugly around your arm. You should be able to place only one finger between the cuff and your arm. 7. Put the cord inside the groove of your elbow. 8. Press the power button. 9. Sit quietly while the cuff fills  with air and loses air. 10. Write down the numbers on the screen. 11. Wait 2-3 minutes and then repeat steps 1-10. What do the numbers mean? Two numbers make up your blood pressure. The first number is called systolic pressure. The second is called diastolic pressure. An example of a blood pressure reading is "120 over 80" (or 120/80). If you are an adult and do not have a medical condition, use this guide to find out if your blood pressure is normal: Normal   First number: below 120.  Second number: below 80. Elevated   First number: 120-129.  Second number: below 80. Hypertension stage 1   First number: 130-139.  Second number: 80-89. Hypertension stage 2   First number: 140 or above.  Second number: 64 or above. Your blood pressure is above normal even if only the top or bottom number is above normal. Follow these instructions at home:  Check your blood pressure as often as your doctor tells you to.  Take your monitor to your next doctor's appointment. Your doctor will:  Make sure you are using it correctly.  Make sure it is working right.  Make sure you understand what your blood pressure numbers should be.  Tell your doctor if your medicines are causing  side effects. Contact a doctor if:  Your blood pressure keeps being high. Get help right away if:  Your first blood pressure number is higher than 180.  Your second blood pressure number is higher than 120. This information is not intended to replace advice given to you by your health care provider. Make sure you discuss any questions you have with your health care provider. Document Released: 12/25/2007 Document Revised: 12/10/2015 Document Reviewed: 06/20/2015 Elsevier Interactive Patient Education  2017 [employer] Inc[employer]      Agustina Caroli, MD [facility]

## 2016-06-16 NOTE — Patient Instructions (Addendum)
   IF you received an x-ray today, you will receive an invoice from [facility]. Please contact [facility] at [phone] with questions or concerns regarding your invoice.   IF you received labwork today, you will receive an invoice from [facility]. Please contact [facility] at [phone] with questions or concerns regarding your invoice.   Our billing staff will not be able to assist you with questions regarding bills from these companies.  You will be contacted with the lab results as soon as they are available. The fastest way to get your results is to activate your My Chart account. Instructions are located on the last page of this paperwork. If you have not heard from us regarding the results in 2 weeks, please contact this office.        Health Maintenance, Male A healthy lifestyle and preventive care is important for your health and wellness. Ask your health care provider about what schedule of regular examinations is right for you. What should I know about weight and diet?  Eat a Healthy Diet  Eat plenty of vegetables, fruits, whole grains, low-fat dairy products, and lean protein.  Do not eat a lot of foods high in solid fats, added sugars, or salt. Maintain a Healthy Weight  Regular exercise can help you achieve or maintain a healthy weight. You should:  Do at least 150 minutes of exercise each week. The exercise should increase your heart rate and make you sweat (moderate-intensity exercise).  Do strength-training exercises at least twice a week. Watch Your Levels of Cholesterol and Blood Lipids  Have your blood tested for lipids and cholesterol every 5 years starting at 44 years of age. If you are at high risk for heart disease, you should start having your blood tested when you are 44 years old. You may need to have your cholesterol levels checked more often if:  Your lipid or cholesterol levels are high.  You are older than 44 years of  age.  You are at high risk for heart disease. What should I know about cancer screening? Many types of cancers can be detected early and may often be prevented. Lung Cancer  You should be screened every year for lung cancer if:  You are a current smoker who has smoked for at least 30 years.  You are a former smoker who has quit within the past 15 years.  Talk to your health care provider about your screening options, when you should start screening, and how often you should be screened. Colorectal Cancer  Routine colorectal cancer screening usually begins at 44 years of age and should be repeated every 5-10 years until you are 44 years old. You may need to be screened more often if early forms of precancerous polyps or small growths are found. Your health care provider may recommend screening at an earlier age if you have risk factors for colon cancer.  Your health care provider may recommend using home test kits to check for hidden blood in the stool.  A small camera at the end of a tube can be used to examine your colon (sigmoidoscopy or colonoscopy). This checks for the earliest forms of colorectal cancer. Prostate and Testicular Cancer  Depending on your age and overall health, your health care provider may do certain tests to screen for prostate and testicular cancer.  Talk to your health care provider about any symptoms or concerns you have about testicular or prostate cancer. Skin Cancer  Check your skin from head   to toe regularly.  Tell your health care provider about any new moles or changes in moles, especially if:  There is a change in a mole's size, shape, or color.  You have a mole that is larger than a pencil eraser.  Always use sunscreen. Apply sunscreen liberally and repeat throughout the day.  Protect yourself by wearing long sleeves, pants, a wide-brimmed hat, and sunglasses when outside. What should I know about heart disease, diabetes, and high blood  pressure?  If you are 18-39 years of age, have your blood pressure checked every 3-5 years. If you are 40 years of age or older, have your blood pressure checked every year. You should have your blood pressure measured twice-once when you are at a hospital or clinic, and once when you are not at a hospital or clinic. Record the average of the two measurements. To check your blood pressure when you are not at a hospital or clinic, you can use:  An automated blood pressure machine at a pharmacy.  A home blood pressure monitor.  Talk to your health care provider about your target blood pressure.  If you are between 45-79 years old, ask your health care provider if you should take aspirin to prevent heart disease.  Have regular diabetes screenings by checking your fasting blood sugar level.  If you are at a normal weight and have a low risk for diabetes, have this test once every three years after the age of 45.  If you are overweight and have a high risk for diabetes, consider being tested at a younger age or more often.  A one-time screening for abdominal aortic aneurysm (AAA) by ultrasound is recommended for men aged 65-75 years who are current or former smokers. What should I know about preventing infection? Hepatitis B  If you have a higher risk for hepatitis B, you should be screened for this virus. Talk with your health care provider to find out if you are at risk for hepatitis B infection. Hepatitis C  Blood testing is recommended for:  Everyone born from 1945 through 1965.  Anyone with known risk factors for hepatitis C. Sexually Transmitted Diseases (STDs)  You should be screened each year for STDs including gonorrhea and chlamydia if:  You are sexually active and are younger than 44 years of age.  You are older than 44 years of age and your health care provider tells you that you are at risk for this type of infection.  Your sexual activity has changed since you were last  screened and you are at an increased risk for chlamydia or gonorrhea. Ask your health care provider if you are at risk.  Talk with your health care provider about whether you are at high risk of being infected with HIV. Your health care provider may recommend a prescription medicine to help prevent HIV infection. What else can I do?  Schedule regular health, dental, and eye exams.  Stay current with your vaccines (immunizations).  Do not use any tobacco products, such as cigarettes, chewing tobacco, and e-cigarettes. If you need help quitting, ask your health care provider.  Limit alcohol intake to no more than 2 drinks per day. One drink equals 12 ounces of beer, 5 ounces of wine, or 1 ounces of hard liquor.  Do not use street drugs.  Do not share needles.  Ask your health care provider for help if you need support or information about quitting drugs.  Tell your health care provider if you   often feel depressed.  Tell your health care provider if you have ever been abused or do not feel safe at home. This information is not intended to replace advice given to you by your health care provider. Make sure you discuss any questions you have with your health care provider. Document Released: 07/10/2007 Document Revised: 09/10/2015 Document Reviewed: 10/15/2014 Elsevier Interactive Patient Education  2017 [facility]) Exercise Recommendation  Being physically active is important to prevent heart disease and stroke, the nation's No. 1and No. 5killers. To improve overall cardiovascular health, we suggest at least 150 minutes per week of moderate exercise or 75 minutes per week of vigorous exercise (or a combination of moderate and vigorous activity). Thirty minutes a day, five times a week is an easy goal to remember. You will also experience benefits even if you divide your time into two or three segments of 10 to 15 minutes per day.  For people who would  benefit from lowering their blood pressure or cholesterol, we recommend 40 minutes of aerobic exercise of moderate to vigorous intensity three to four times a week to lower the risk for heart attack and stroke.  Physical activity is anything that makes you move your body and burn calories.  This includes things like climbing stairs or playing sports. Aerobic exercises benefit your heart, and include walking, jogging, swimming or biking. Strength and stretching exercises are best for overall stamina and flexibility.  The simplest, positive change you can make to effectively improve your heart health is to start walking. It's enjoyable, free, easy, social and great exercise. A walking program is flexible and boasts high success rates because people can stick with it. It's easy for walking to become a regular and satisfying part of life.   For Overall Cardiovascular Health:  At least 30 minutes of moderate-intensity aerobic activity at least 5 days per week for a total of 150  OR   At least 25 minutes of vigorous aerobic activity at least 3 days per week for a total of 75 minutes; or a combination of moderate- and vigorous-intensity aerobic activity  AND   Moderate- to high-intensity muscle-strengthening activity at least 2 days per week for additional health benefits.  For Lowering Blood Pressure and Cholesterol  An average 40 minutes of moderate- to vigorous-intensity aerobic activity 3 or 4 times per week  What if I can't make it to the time goal? Something is always better than nothing! And everyone has to start somewhere. Even if you've been sedentary for years, today is the day you can begin to make healthy changes in your life. If you don't think you'll make it for 30 or 40 minutes, set a reachable goal for today. You can work up toward your overall goal by increasing your time as you get stronger. Don't let all-or-nothing thinking rob you of doing what you can every day.   Source:http://www.heart.org    How to Take Your Blood Pressure You can take your blood pressure at home with a machine. You may need to check your blood pressure at home:  To check if you have high blood pressure (hypertension).  To check your blood pressure over time.  To make sure your blood pressure medicine is working. Supplies needed: You will need a blood pressure machine, or monitor. You can buy one at a drugstore or online. When choosing one:  Choose one with an arm cuff.  Choose one that wraps around your upper arm. Only one  finger should fit between your arm and the cuff.  Do not choose one that measures your blood pressure from your wrist or finger. Your doctor can suggest a monitor. How to prepare Avoid these things for 30 minutes before checking your blood pressure:  Drinking caffeine.  Drinking alcohol.  Eating.  Smoking.  Exercising. Five minutes before checking your blood pressure:  Pee.  Sit in a dining chair. Avoid sitting in a soft couch or armchair.  Be quiet. Do not talk. How to take your blood pressure Follow the instructions that came with your machine. If you have a digital blood pressure monitor, these may be the instructions: 1. Sit up straight. 2. Place your feet on the floor. Do not cross your ankles or legs. 3. Rest your left arm at the level of your heart. You may rest it on a table, desk, or chair. 4. Pull up your shirt sleeve. 5. Wrap the blood pressure cuff around the upper part of your left arm. The cuff should be 1 inch (2.5 cm) above your elbow. It is best to wrap the cuff around bare skin. 6. Fit the cuff snugly around your arm. You should be able to place only one finger between the cuff and your arm. 7. Put the cord inside the groove of your elbow. 8. Press the power button. 9. Sit quietly while the cuff fills with air and loses air. 10. Write down the numbers on the screen. 11. Wait 2-3 minutes and then repeat steps  1-10. What do the numbers mean? Two numbers make up your blood pressure. The first number is called systolic pressure. The second is called diastolic pressure. An example of a blood pressure reading is "120 over 80" (or 120/80). If you are an adult and do not have a medical condition, use this guide to find out if your blood pressure is normal: Normal   First number: below 120.  Second number: below 80. Elevated   First number: 120-129.  Second number: below 80. Hypertension stage 1   First number: 130-139.  Second number: 80-89. Hypertension stage 2   First number: 140 or above.  Second number: 32 or above. Your blood pressure is above normal even if only the top or bottom number is above normal. Follow these instructions at home:  Check your blood pressure as often as your doctor tells you to.  Take your monitor to your next doctor's appointment. Your doctor will:  Make sure you are using it correctly.  Make sure it is working right.  Make sure you understand what your blood pressure numbers should be.  Tell your doctor if your medicines are causing side effects. Contact a doctor if:  Your blood pressure keeps being high. Get help right away if:  Your first blood pressure number is higher than 180.  Your second blood pressure number is higher than 120. This information is not intended to replace advice given to you by your health care provider. Make sure you discuss any questions you have with your health care provider. Document Released: 12/25/2007 Document Revised: 12/10/2015 Document Reviewed: 06/20/2015 Elsevier Interactive Patient Education  2017 [employer].

## 2016-06-17 ENCOUNTER — Encounter: Payer: Self-pay | Admitting: Radiology

## 2016-06-17 LAB — HIV ANTIBODY (ROUTINE TESTING W REFLEX): HIV SCREEN 4TH GENERATION: NONREACTIVE

## 2016-06-17 LAB — CBC WITH DIFFERENTIAL/PLATELET
BASOS: 0 %
Basophils Absolute: 0 10*3/uL (ref 0.0–0.2)
EOS (ABSOLUTE): 0.4 10*3/uL (ref 0.0–0.4)
EOS: 4 %
HEMATOCRIT: 41.9 % (ref 37.5–51.0)
HEMOGLOBIN: 13.9 g/dL (ref 13.0–17.7)
IMMATURE GRANS (ABS): 0 10*3/uL (ref 0.0–0.1)
IMMATURE GRANULOCYTES: 0 %
LYMPHS: 48 %
Lymphocytes Absolute: 4.8 10*3/uL — ABNORMAL HIGH (ref 0.7–3.1)
MCH: 30.6 pg (ref 26.6–33.0)
MCHC: 33.2 g/dL (ref 31.5–35.7)
MCV: 92 fL (ref 79–97)
MONOCYTES: 7 %
Monocytes Absolute: 0.7 10*3/uL (ref 0.1–0.9)
NEUTROS PCT: 41 %
Neutrophils Absolute: 4.1 10*3/uL (ref 1.4–7.0)
Platelets: 363 10*3/uL (ref 150–379)
RBC: 4.54 x10E6/uL (ref 4.14–5.80)
RDW: 13.9 % (ref 12.3–15.4)
WBC: 10 10*3/uL (ref 3.4–10.8)

## 2016-06-17 LAB — LIPID PANEL
Chol/HDL Ratio: 2.9 ratio (ref 0.0–5.0)
Cholesterol, Total: 183 mg/dL (ref 100–199)
HDL: 63 mg/dL (ref >39)
LDL Calculated: 95 mg/dL (ref 0–99)
TRIGLYCERIDES: 125 mg/dL (ref 0–149)
VLDL Cholesterol Cal: 25 mg/dL (ref 5–40)

## 2016-06-17 LAB — HEMOGLOBIN A1C
ESTIMATED AVERAGE GLUCOSE: 120 mg/dL
HEMOGLOBIN A1C: 5.8 % — AB (ref 4.8–5.6)

## 2016-06-17 LAB — URINALYSIS
Bilirubin, UA: NEGATIVE
Glucose, UA: NEGATIVE
KETONES UA: NEGATIVE
Leukocytes, UA: NEGATIVE
NITRITE UA: NEGATIVE
PH UA: 5.5 (ref 5.0–7.5)
Protein, UA: NEGATIVE
RBC, UA: NEGATIVE
Specific Gravity, UA: 1.023 (ref 1.005–1.030)
UUROB: 0.2 mg/dL (ref 0.2–1.0)

## 2016-06-17 LAB — COMPREHENSIVE METABOLIC PANEL
A/G RATIO: 1.7 (ref 1.2–2.2)
ALT: 14 IU/L (ref 0–44)
AST: 20 IU/L (ref 0–40)
Albumin: 4.8 g/dL (ref 3.5–5.5)
Alkaline Phosphatase: 89 IU/L (ref 39–117)
BUN/Creatinine Ratio: 11 (ref 9–20)
BUN: 14 mg/dL (ref 6–24)
Bilirubin Total: <0.2 mg/dL (ref 0.0–1.2)
CALCIUM: 9.8 mg/dL (ref 8.7–10.2)
CO2: 26 mmol/L (ref 18–29)
CREATININE: 1.25 mg/dL (ref 0.76–1.27)
Chloride: 100 mmol/L (ref 96–106)
GFR calc Af Amer: 80 mL/min/{1.73_m2} (ref >59)
GFR, EST NON AFRICAN AMERICAN: 70 mL/min/{1.73_m2} (ref >59)
GLOBULIN, TOTAL: 2.8 g/dL (ref 1.5–4.5)
Glucose: 92 mg/dL (ref 65–99)
Potassium: 4.6 mmol/L (ref 3.5–5.2)
Sodium: 141 mmol/L (ref 134–144)
TOTAL PROTEIN: 7.6 g/dL (ref 6.0–8.5)

## 2016-06-17 LAB — TSH: TSH: 0.842 u[IU]/mL (ref 0.450–4.500)

## 2016-06-17 LAB — GC/CHLAMYDIA PROBE AMP
Chlamydia trachomatis, NAA: NEGATIVE
NEISSERIA GONORRHOEAE BY PCR: NEGATIVE

## 2016-06-22 ENCOUNTER — Telehealth: Payer: Self-pay

## 2016-06-22 MED ORDER — AZITHROMYCIN 500 MG PO TABS: 1000.0000 mg | ORAL_TABLET | Freq: Once | ORAL | 0 refills | Status: AC

## 2016-06-22 NOTE — Telephone Encounter (Signed)
azithromycin powder not available Please send new rx to pharmacy

## 2016-06-22 NOTE — Telephone Encounter (Signed)
Ordered

## 2016-08-26 ENCOUNTER — Ambulatory Visit (INDEPENDENT_AMBULATORY_CARE_PROVIDER_SITE_OTHER): Payer: BLUE CROSS/BLUE SHIELD | Admitting: Emergency Medicine

## 2016-08-26 ENCOUNTER — Encounter: Payer: Self-pay | Admitting: Emergency Medicine

## 2016-08-26 VITALS — BP 128/82 | HR 81 | Temp 98.3°F | Resp 16 | Ht 73.0 in | Wt 173.4 lb

## 2016-08-26 DIAGNOSIS — M545 Low back pain, unspecified: Secondary | ICD-10-CM | POA: Insufficient documentation

## 2016-08-26 DIAGNOSIS — S39012A Strain of muscle, fascia and tendon of lower back, initial encounter: Secondary | ICD-10-CM | POA: Insufficient documentation

## 2016-08-26 MED ORDER — CYCLOBENZAPRINE HCL 10 MG PO TABS: 10.0000 mg | ORAL_TABLET | Freq: Three times a day (TID) | ORAL | 0 refills | Status: AC | PRN

## 2016-08-26 MED ORDER — DICLOFENAC SODIUM 75 MG PO TBEC: 75.0000 mg | DELAYED_RELEASE_TABLET | Freq: Two times a day (BID) | ORAL | 0 refills | Status: AC

## 2016-08-26 NOTE — Progress Notes (Signed)
John Wang 46 y.o.   Chief Complaint  Patient presents with  . Back Pain    lower areas x 2 days    HISTORY OF PRESENT ILLNESS: This is a 46 y.o. male complaining of low back pain x 2-3 days. Denies trauma.  Back Pain  This is a new problem. The current episode started in the past 7 days. The problem occurs constantly. The problem has been waxing and waning since onset. The pain is present in the lumbar spine. The quality of the pain is described as aching. The pain does not radiate. The pain is at a severity of 5/10. The pain is moderate. The pain is the same all the time. The symptoms are aggravated by bending and position. Pertinent negatives include no abdominal pain, bladder incontinence, bowel incontinence, chest pain, dysuria, fever, headaches, leg pain, numbness, paresis, paresthesias, pelvic pain, perianal numbness, tingling or weakness. Risk factors: none.     Prior to Admission medications   Medication Sig Start Date End Date Taking? Authorizing Provider  HYDROcodone-acetaminophen (NORCO) 5-325 MG tablet Take 1 tablet by mouth every 6 (six) hours as needed for moderate pain. Patient not taking: Reported on 06/16/2016 04/28/16   Horald Pollen, MD    No Known Allergies  Patient Active Problem List   Diagnosis Date Noted  . Generalized muscle ache 04/28/2016  . Acute pharyngitis 04/28/2016  . Acute upper respiratory infection 04/28/2016  . Flu-like symptoms 04/28/2016    Past Medical History:  Diagnosis Date  . Asthma     No past surgical history on file.  Social History   Social History  . Marital status: Single    Spouse name: N/A  . Number of children: N/A  . Years of education: N/A   Occupational History  . Not on file.   Social History Main Topics  . Smoking status: Current Every Day Smoker    Packs/day: 0.50    Types: Cigarettes  . Smokeless tobacco: Never Used  . Alcohol use 1.8 oz/week    3 Shots of liquor per week  . Drug use: No    . Sexual activity: Yes   Other Topics Concern  . Not on file   Social History Narrative  . No narrative on file    No family history on file.   Review of Systems  Constitutional: Negative for chills and fever.  HENT: Negative.   Eyes: Negative.   Respiratory: Negative for cough and shortness of breath.   Cardiovascular: Negative for chest pain and palpitations.  Gastrointestinal: Negative for abdominal pain, blood in stool, bowel incontinence, melena, nausea and vomiting.  Genitourinary: Negative for bladder incontinence, dysuria, hematuria and pelvic pain.  Musculoskeletal: Positive for back pain.  Skin: Negative for rash.  Neurological: Negative.  Negative for dizziness, tingling, sensory change, focal weakness, weakness, numbness, headaches and paresthesias.  Endo/Heme/Allergies: Negative.   All other systems reviewed and are negative.  Vitals:   08/26/16 1209  BP: 128/82  Pulse: 81  Resp: 16  Temp: 98.3 F (36.8 C)     Physical Exam  Constitutional: He is oriented to person, place, and time. He appears well-developed and well-nourished.  HENT:  Head: Normocephalic.  Eyes: Pupils are equal, round, and reactive to light. Conjunctivae and EOM are normal.  Neck: Normal range of motion. Neck supple.  Cardiovascular: Normal rate, regular rhythm and normal heart sounds.   Pulmonary/Chest: Effort normal and breath sounds normal.  Abdominal: Soft. Bowel sounds are normal. He exhibits no  distension. There is no tenderness.  Musculoskeletal:       Lumbar back: He exhibits tenderness, pain and spasm. He exhibits no bony tenderness, no swelling and normal pulse.       Back:  Neurological: He is alert and oriented to person, place, and time. He displays normal reflexes. No sensory deficit. He exhibits normal muscle tone. Coordination normal.  Skin: Skin is warm and dry. Capillary refill takes less than 2 seconds. No rash noted.  Psychiatric: He has a normal mood and affect.  His behavior is normal.  Vitals reviewed.    ASSESSMENT & PLAN: Hebert was seen today for back pain.  Diagnoses and all orders for this visit:  Lumbar strain, initial encounter  Acute bilateral low back pain without sciatica  Lumbar pain  Other orders -     diclofenac (VOLTAREN) 75 MG EC tablet; Take 1 tablet (75 mg total) by mouth 2 (two) times daily. -     cyclobenzaprine (FLEXERIL) 10 MG tablet; Take 1 tablet (10 mg total) by mouth 3 (three) times daily as needed for muscle spasms.    Patient Instructions       IF you received an x-ray today, you will receive an invoice from [facility]. Please contact [facility] at [phone] with questions or concerns regarding your invoice.   IF you received labwork today, you will receive an invoice from [facility]. Please contact [facility] at [phone] with questions or concerns regarding your invoice.   Our billing staff will not be able to assist you with questions regarding bills from these companies.  You will be contacted with the lab results as soon as they are available. The fastest way to get your results is to activate your My Chart account. Instructions are located on the last page of this paperwork. If you have not heard from [place] regarding the results in 2 weeks, please contact this office.     Lumbosacral Strain Lumbosacral strain is an injury that causes pain in the lower back (lumbosacral spine). This injury usually occurs from overstretching the muscles or ligaments along your spine. A strain can affect one or more muscles or cord-like tissues that connect bones to other bones (ligaments). What are the causes? This condition may be caused by:  A hard, direct hit (blow) to the back.  Excessive stretching of the lower back muscles. This may result from: ? A fall. ? Lifting something heavy. ? Repetitive movements such as bending or crouching.  What increases the risk? The following factors may  increase your risk of getting this condition:  Participating in sports or activities that involve: ? A sudden twist of the back. ? Pushing or pulling motions.  Being overweight or obese.  Having poor strength and flexibility, especially tight hamstrings or weak muscles in the back or abdomen.  Having too much of a curve in the lower back.  Having a pelvis that is tilted forward.  What are the signs or symptoms? The main symptom of this condition is pain in the lower back, at the site of the strain. Pain may extend (radiate) down one or both legs. How is this diagnosed? This condition is diagnosed based on:  Your symptoms.  Your medical history.  A physical exam. ? Your health care provider may push on certain areas of your back to determine the source of your pain. ? You may be asked to bend forward, backward, and side to side to assess the severity of your pain and your range  of motion.  Imaging tests, such as: ? X-rays. ? MRI.  How is this treated? Treatment for this condition may include:  Putting heat and cold on the affected area.  Medicines to help relieve pain and relax your muscles (muscle relaxants).  NSAIDs to help reduce swelling and discomfort.  When your symptoms improve, it is important to gradually return to your normal routine as soon as possible to reduce pain, avoid stiffness, and avoid loss of muscle strength. Generally, symptoms should improve within 6 weeks of treatment. However, recovery time varies. Follow these instructions at home: Managing pain, stiffness, and swelling   If directed, put ice on the injured area during the first 24 hours after your strain. ? Put ice in a plastic bag. ? Place a towel between your skin and the bag. ? Leave the ice on for 20 minutes, 2-3 times a day.  If directed, put heat on the affected area as often as told by your health care provider. Use the heat source that your health care provider recommends, such as a  moist heat pack or a heating pad. ? Place a towel between your skin and the heat source. ? Leave the heat on for 20-30 minutes. ? Remove the heat if your skin turns bright red. This is especially important if you are unable to feel pain, heat, or cold. You may have a greater risk of getting burned. Activity  Rest and return to your normal activities as told by your health care provider. Ask your health care provider what activities are safe for you.  Avoid activities that take a lot of energy for as long as told by your health care provider. General instructions  Take over-the-counter and prescription medicines only as told by your health care provider.  Donot drive or use heavy machinery while taking prescription pain medicine.  Do not use any products that contain nicotine or tobacco, such as cigarettes and e-cigarettes. If you need help quitting, ask your health care provider.  Keep all follow-up visits as told by your health care provider. This is important. How is this prevented?  Use correct form when playing sports and lifting heavy objects.  Use good posture when sitting and standing.  Maintain a healthy weight.  Sleep on a mattress with medium firmness to support your back.  Be safe and responsible while being active to avoid falls.  Do at least 150 minutes of moderate-intensity exercise each week, such as brisk walking or water aerobics. Try a form of exercise that takes stress off your back, such as swimming or stationary cycling.  Maintain physical fitness, including: ? Strength. ? Flexibility. ? Cardiovascular fitness. ? Endurance. Contact a health care provider if:  Your back pain does not improve after 6 weeks of treatment.  Your symptoms get worse. Get help right away if:  Your back pain is severe.  You cannot stand or walk.  You have difficulty controlling when you urinate or when you have a bowel movement.  You feel nauseous or you vomit.  Your  feet get very cold.  You have numbness, tingling, weakness, or problems using your arms or legs.  You develop any of the following: ? Shortness of breath. ? Dizziness. ? Pain in your legs. ? Weakness in your buttocks or legs. ? Discoloration of the skin on your toes or legs. This information is not intended to replace advice given to you by your health care provider. Make sure you discuss any questions you have with your  health care provider. Document Released: 10/21/2004 Document Revised: 08/01/2015 Document Reviewed: 06/15/2015 Elsevier Interactive Patient Education  2017 [employer]      Agustina Caroli, MD [facility]

## 2016-08-26 NOTE — Patient Instructions (Addendum)
IF you received an x-ray today, you will receive an invoice from [facility]. Please contact [facility] at [phone] with questions or concerns regarding your invoice.   IF you received labwork today, you will receive an invoice from [facility]. Please contact [facility] at [phone] with questions or concerns regarding your invoice.   Our billing staff will not be able to assist you with questions regarding bills from these companies.  You will be contacted with the lab results as soon as they are available. The fastest way to get your results is to activate your My Chart account. Instructions are located on the last page of this paperwork. If you have not heard from [place] regarding the results in 2 weeks, please contact this office.     Lumbosacral Strain Lumbosacral strain is an injury that causes pain in the lower back (lumbosacral spine). This injury usually occurs from overstretching the muscles or ligaments along your spine. A strain can affect one or more muscles or cord-like tissues that connect bones to other bones (ligaments). What are the causes? This condition may be caused by:  A hard, direct hit (blow) to the back.  Excessive stretching of the lower back muscles. This may result from: ? A fall. ? Lifting something heavy. ? Repetitive movements such as bending or crouching.  What increases the risk? The following factors may increase your risk of getting this condition:  Participating in sports or activities that involve: ? A sudden twist of the back. ? Pushing or pulling motions.  Being overweight or obese.  Having poor strength and flexibility, especially tight hamstrings or weak muscles in the back or abdomen.  Having too much of a curve in the lower back.  Having a pelvis that is tilted forward.  What are the signs or symptoms? The main symptom of this condition is pain in the lower back, at the site of the strain. Pain may extend  (radiate) down one or both legs. How is this diagnosed? This condition is diagnosed based on:  Your symptoms.  Your medical history.  A physical exam. ? Your health care provider may push on certain areas of your back to determine the source of your pain. ? You may be asked to bend forward, backward, and side to side to assess the severity of your pain and your range of motion.  Imaging tests, such as: ? X-rays. ? MRI.  How is this treated? Treatment for this condition may include:  Putting heat and cold on the affected area.  Medicines to help relieve pain and relax your muscles (muscle relaxants).  NSAIDs to help reduce swelling and discomfort.  When your symptoms improve, it is important to gradually return to your normal routine as soon as possible to reduce pain, avoid stiffness, and avoid loss of muscle strength. Generally, symptoms should improve within 6 weeks of treatment. However, recovery time varies. Follow these instructions at home: Managing pain, stiffness, and swelling   If directed, put ice on the injured area during the first 24 hours after your strain. ? Put ice in a plastic bag. ? Place a towel between your skin and the bag. ? Leave the ice on for 20 minutes, 2-3 times a day.  If directed, put heat on the affected area as often as told by your health care provider. Use the heat source that your health care provider recommends, such as a moist heat pack or a heating pad. ? Place a towel between your skin  and the heat source. ? Leave the heat on for 20-30 minutes. ? Remove the heat if your skin turns bright red. This is especially important if you are unable to feel pain, heat, or cold. You may have a greater risk of getting burned. Activity  Rest and return to your normal activities as told by your health care provider. Ask your health care provider what activities are safe for you.  Avoid activities that take a lot of energy for as long as told by your  health care provider. General instructions  Take over-the-counter and prescription medicines only as told by your health care provider.  Donot drive or use heavy machinery while taking prescription pain medicine.  Do not use any products that contain nicotine or tobacco, such as cigarettes and e-cigarettes. If you need help quitting, ask your health care provider.  Keep all follow-up visits as told by your health care provider. This is important. How is this prevented?  Use correct form when playing sports and lifting heavy objects.  Use good posture when sitting and standing.  Maintain a healthy weight.  Sleep on a mattress with medium firmness to support your back.  Be safe and responsible while being active to avoid falls.  Do at least 150 minutes of moderate-intensity exercise each week, such as brisk walking or water aerobics. Try a form of exercise that takes stress off your back, such as swimming or stationary cycling.  Maintain physical fitness, including: ? Strength. ? Flexibility. ? Cardiovascular fitness. ? Endurance. Contact a health care provider if:  Your back pain does not improve after 6 weeks of treatment.  Your symptoms get worse. Get help right away if:  Your back pain is severe.  You cannot stand or walk.  You have difficulty controlling when you urinate or when you have a bowel movement.  You feel nauseous or you vomit.  Your feet get very cold.  You have numbness, tingling, weakness, or problems using your arms or legs.  You develop any of the following: ? Shortness of breath. ? Dizziness. ? Pain in your legs. ? Weakness in your buttocks or legs. ? Discoloration of the skin on your toes or legs. This information is not intended to replace advice given to you by your health care provider. Make sure you discuss any questions you have with your health care provider. Document Released: 10/21/2004 Document Revised: 08/01/2015 Document Reviewed:  06/15/2015 Elsevier Interactive Patient Education  2017 [employer].

## 2020-04-21 ENCOUNTER — Other Ambulatory Visit: Payer: Self-pay

## 2020-04-21 ENCOUNTER — Ambulatory Visit
Admission: EM | Admit: 2020-04-21 | Discharge: 2020-04-21 | Disposition: A | Discharge status: Home / Self Care | Payer: Self-pay

## 2020-04-21 ENCOUNTER — Encounter: Payer: Self-pay | Admitting: Emergency Medicine

## 2020-04-21 DIAGNOSIS — J069 Acute upper respiratory infection, unspecified: Secondary | ICD-10-CM

## 2020-04-21 NOTE — ED Triage Notes (Signed)
Thursday patient had a fever of 101.  Complains of a odd taste in mouth.  Patient has a cough.  Patient is requesting a covid test

## 2020-04-21 NOTE — Discharge Instructions (Signed)
Self isolate until covid results are back.  We will notify you by phone if it is positive. Your negative results will be sent through your MyChart.    If it is positive you need to isolate from others for a total of 5 days. If no fever for 24 hours without medications, and symptoms improving you may end isolation on day 6, but wear a mask if around any others for an additional 5 days.   Over the counter medications as needed for symptoms.  If symptoms worsen or do not improve in the next week to return to be seen or to follow up with your PCP.

## 2020-04-21 NOTE — ED Provider Notes (Signed)
[name]    CSN: [id number] Arrival date & time: 04/21/20  4098      History   Chief Complaint Chief Complaint  Patient presents with  . Cough    HPI John Wang is a 50 y.o. male.   Hassell Done presents with requests for covid-19 testing. He states that symptoms started Thursday 3/24, with sore throat. 3/25 with fever up to 101 and "tight" cough. He proceeded to take over the counter medications and vitamins which have helped. He reports he overall feels better currently. Still some cough but it is "looser". History of asthma, and smokes. Denies any use of inhaler, declines inhaler refill today.  Diarrhea x1 day initially, but has since resolved. No further fever. He is Vaccinated for covid, not boosted. Endorses he now has a bad taste in mouth so is interested in testing for covid-19 before returning to work. No known ill contacts.     ROS per HPI, negative if not otherwise mentioned.       Past Medical History:  Diagnosis Date  . Asthma     Patient Active Problem List   Diagnosis Date Noted  . Lumbar pain 08/26/2016  . Lumbar strain, initial encounter 08/26/2016  . Generalized muscle ache 04/28/2016  . Acute pharyngitis 04/28/2016  . Acute upper respiratory infection 04/28/2016  . Flu-like symptoms 04/28/2016    History reviewed. No pertinent surgical history.     Home Medications    Prior to Admission medications   Medication Sig Start Date End Date Taking? Authorizing Provider  guaiFENesin (MUCINEX) 600 MG 12 hr tablet Take by mouth 2 (two) times daily.   Yes [provider]  cyclobenzaprine (FLEXERIL) 10 MG tablet Take 1 tablet (10 mg total) by mouth 3 (three) times daily as needed for muscle spasms. 08/26/16   Horald Pollen, MD  HYDROcodone-acetaminophen [facility]) 5-325 MG tablet Take 1 tablet by mouth every 6 (six) hours as needed for moderate pain. Patient not taking: No sig reported 04/28/16   Horald Pollen, MD     Family History History reviewed. No pertinent family history.  Social History Social History   Tobacco Use  . Smoking status: Current Every Day Smoker    Packs/day: 0.50    Types: Cigarettes  . Smokeless tobacco: Never Used  Substance Use Topics  . Alcohol use: Yes    Alcohol/week: 3.0 standard drinks    Types: 3 Shots of liquor per week  . Drug use: No    Types: Cocaine, Marijuana     Allergies   Patient has no known allergies.   Review of Systems Review of Systems   Physical Exam Triage Vital Signs ED Triage Vitals  Enc Vitals Group     BP 04/21/20 0825 122/82     Pulse Rate 04/21/20 0825 87     Resp 04/21/20 0825 18     Temp 04/21/20 0825 98.2 F (36.8 C)     Temp Source 04/21/20 0825 Oral     SpO2 04/21/20 0825 98 %     Weight --      Height --      Head Circumference --      Peak Flow --      Pain Score 04/21/20 0823 0     Pain Loc --      Pain Edu? --      Excl. in [place]? --    No data found.  Updated Vital Signs BP 122/82 (BP Location: Right  Arm)   Pulse 87   Temp 98.2 F (36.8 C) (Oral)   Resp 18   SpO2 98%   Visual Acuity Right Eye Distance:   Left Eye Distance:   Bilateral Distance:    Right Eye Near:   Left Eye Near:    Bilateral Near:     Physical Exam Vitals reviewed.  Constitutional:      Appearance: He is well-developed.  HENT:     Head: Normocephalic and atraumatic.     Nose: Nose normal.     Right Sinus: No maxillary sinus tenderness or frontal sinus tenderness.     Left Sinus: No maxillary sinus tenderness or frontal sinus tenderness.     Mouth/Throat:     Pharynx: Uvula midline.  Eyes:     Conjunctiva/sclera: Conjunctivae normal.     Pupils: Pupils are equal, round, and reactive to light.  Cardiovascular:     Rate and Rhythm: Normal rate and regular rhythm.  Pulmonary:     Effort: Pulmonary effort is normal.     Breath sounds: Normal breath sounds.  Musculoskeletal:     Cervical back: Normal range of motion.   Lymphadenopathy:     Cervical: No cervical adenopathy.  Skin:    General: Skin is warm and dry.  Neurological:     Mental Status: He is alert and oriented to person, place, and time.      UC Treatments / Results  Labs (all labs ordered are listed, but only abnormal results are displayed) Labs Reviewed  NOVEL CORONAVIRUS, NAA    EKG   Radiology No results found.  Procedures Procedures (including critical care time)  Medications Ordered in UC Medications - No data to display  Initial Impression / Assessment and Plan / UC Course  I have reviewed the triage vital signs and the nursing notes.  Pertinent labs & imaging results that were available during my care of the patient were reviewed by me and considered in my medical decision making (see chart for details).     Non toxic. Benign physical exam.  No work of breathing. Vitals stable here today. Declines inhaler refill or tessalon capsules. Will continue with prn over the counter medications as needed for likely viral URI. Covid testing pending and isolation instructions provided.  Return precautions provided. Patient verbalized understanding and agreeable to plan.   Final Clinical Impressions(s) / UC Diagnoses   Final diagnoses:  Viral URI with cough     Discharge Instructions     Self isolate until covid results are back.  We will notify you by phone if it is positive. Your negative results will be sent through your MyChart.    If it is positive you need to isolate from others for a total of 5 days. If no fever for 24 hours without medications, and symptoms improving you may end isolation on day 6, but wear a mask if around any others for an additional 5 days.   Over the counter medications as needed for symptoms.  If symptoms worsen or do not improve in the next week to return to be seen or to follow up with your PCP.     ED Prescriptions    None     PDMP not reviewed this encounter.   Zigmund Gottron,  NP 04/21/20 [id number]

## 2020-04-22 LAB — SARS-COV-2, NAA 2 DAY TAT

## 2020-04-22 LAB — NOVEL CORONAVIRUS, NAA: SARS-CoV-2, NAA: NOT DETECTED

## 2020-10-28 ENCOUNTER — Other Ambulatory Visit: Payer: Self-pay

## 2020-10-28 ENCOUNTER — Ambulatory Visit
Admission: EM | Admit: 2020-10-28 | Discharge: 2020-10-28 | Disposition: A | Discharge status: Home / Self Care | Payer: 59 | Attending: Physician Assistant | Admitting: Physician Assistant

## 2020-10-28 DIAGNOSIS — L309 Dermatitis, unspecified: Secondary | ICD-10-CM | POA: Diagnosis not present

## 2020-10-28 DIAGNOSIS — M7521 Bicipital tendinitis, right shoulder: Secondary | ICD-10-CM

## 2020-10-28 MED ORDER — PREDNISONE 20 MG PO TABS: 40.0000 mg | ORAL_TABLET | Freq: Every day | ORAL | 0 refills | Status: AC

## 2020-10-28 MED ORDER — TRIAMCINOLONE ACETONIDE 0.1 % EX CREA: 1.0000 "application " | TOPICAL_CREAM | Freq: Two times a day (BID) | CUTANEOUS | 0 refills | Status: AC

## 2020-10-28 NOTE — Discharge Instructions (Signed)
Take antibiotic as prescribed. Follow up with ortho if no improvement toward the end of the week.

## 2020-10-28 NOTE — ED Triage Notes (Signed)
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 Pt localizes pain to the anterior aspect of his arm. No forearm pain. No meds taken. Has been getting some relief with a heating paid. No swelling or bruising. Pain increases with lifting.

## 2020-10-28 NOTE — ED Provider Notes (Signed)
[name]    CSN: [id number] Arrival date & time: 10/28/20  1618      History   Chief Complaint Chief Complaint  Patient presents with   Arm Pain    right    HPI John Wang is a 50 y.o. male.   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 He reports he had pain but it did improve and now only occurs when he is lifting. He has not had any numbness or tingling. He does not report injury to this arm in the past. He has tried using a warm compress with some relief as well.   He requests medication for eczema if possible. Currently he is experiencing a flare to his chest and upper inner thighs although he reports rash seems to move around. Consistent with previous eczema.   The history is provided by the patient.  Arm Pain Pertinent negatives include no shortness of breath.   Past Medical History:  Diagnosis Date   Asthma     Patient Active Problem List   Diagnosis Date Noted   Lumbar pain 08/26/2016   Lumbar strain, initial encounter 08/26/2016   Generalized muscle ache 04/28/2016   Acute pharyngitis 04/28/2016   Acute upper respiratory infection 04/28/2016   Flu-like symptoms 04/28/2016    History reviewed. No pertinent surgical history.     Home Medications    Prior to Admission medications   Medication Sig Start Date End Date Taking? Authorizing Provider  predniSONE (DELTASONE) 20 MG tablet Take 2 tablets (40 mg total) by mouth daily with breakfast for 5 days. 10/28/20 11/02/20 Yes Francene Finders, PA-C  triamcinolone cream (KENALOG) 0.1 % Apply 1 application topically 2 (two) times daily. 10/28/20  Yes Francene Finders, PA-C  cyclobenzaprine (FLEXERIL) 10 MG tablet Take 1 tablet (10 mg total) by mouth 3 (three) times daily as needed for muscle spasms. 08/26/16   Horald Pollen, MD  guaiFENesin (MUCINEX) 600 MG  12 hr tablet Take by mouth 2 (two) times daily.    [provider]  HYDROcodone-acetaminophen (NORCO) 5-325 MG tablet Take 1 tablet by mouth every 6 (six) hours as needed for moderate pain. Patient not taking: No sig reported 04/28/16   Horald Pollen, MD    Family History History reviewed. No pertinent family history.  Social History Social History   Tobacco Use   Smoking status: Every Day    Packs/day: 0.50    Types: Cigarettes   Smokeless tobacco: Never  Substance Use Topics   Alcohol use: Yes    Alcohol/week: 3.0 standard drinks    Types: 3 Shots of liquor per week   Drug use: No    Types: Cocaine, Marijuana     Allergies   Fish allergy   Review of Systems Review of Systems  Constitutional:  Negative for chills and fever.  Eyes:  Negative for pain and discharge.  Respiratory:  Negative for shortness of breath.   Musculoskeletal:  Positive for myalgias. Negative for arthralgias and joint swelling.  Skin:  Positive for rash. Negative for color change.  Neurological:  Negative for numbness.    Physical Exam Triage Vital Signs ED Triage Vitals  Enc Vitals Group     BP 10/28/20 1705 (!) 143/92     Pulse Rate 10/28/20 1705 96     Resp  10/28/20 1705 18     Temp 10/28/20 1705 97.9 F (36.6 C)     Temp Source 10/28/20 1705 Oral     SpO2 10/28/20 1705 97 %     Weight --      Height --      Head Circumference --      Peak Flow --      Pain Score 10/28/20 1708 5     Pain Loc --      Pain Edu? --      Excl. in [place]? --    No data found.  Updated Vital Signs BP (!) 143/92 (BP Location: Left Arm)   Pulse 96   Temp 97.9 F (36.6 C) (Oral)   Resp 18   SpO2 97%     Physical Exam Vitals and nursing note reviewed.  Constitutional:      General: He is not in acute distress.    Appearance: Normal appearance. He is not ill-appearing.  HENT:     Head: Normocephalic and atraumatic.  Eyes:     Conjunctiva/sclera: Conjunctivae normal.   Cardiovascular:     Rate and Rhythm: Normal rate.  Pulmonary:     Effort: Pulmonary effort is normal.  Musculoskeletal:     Right upper arm: Tenderness (mild ttp to bicep tendon) present. No swelling or deformity.     Right elbow: Normal. No swelling or deformity. Normal range of motion.  Skin:    General: Skin is warm and dry.  Neurological:     Mental Status: He is alert.     Comments: Grip strength 5/5 bilaterally     UC Treatments / Results  Labs (all labs ordered are listed, but only abnormal results are displayed) Labs Reviewed - No data to display  EKG   Radiology No results found.  Procedures Procedures (including critical care time)  Medications Ordered in UC Medications - No data to display  Initial Impression / Assessment and Plan / UC Course  I have reviewed the triage vital signs and the nursing notes.  Pertinent labs & imaging results that were available during my care of the patient were reviewed by me and considered in my medical decision making (see chart for details).   Will trial steroid taper. Lifting restrictions prescribed. Plan follow up with ortho if no improvement. Steroid cream prescribed for eczema. Plan follow up with any further concerns.   Final Clinical Impressions(s) / UC Diagnoses   Final diagnoses:  Biceps tendinitis of right upper extremity  Eczema, unspecified type     Discharge Instructions      Take antibiotic as prescribed. Follow up with ortho if no improvement toward the end of the week.      ED Prescriptions     Medication Sig Dispense Auth. Provider   predniSONE (DELTASONE) 20 MG tablet Take 2 tablets (40 mg total) by mouth daily with breakfast for 5 days. 10 tablet Ewell Poe F, PA-C   triamcinolone cream (KENALOG) 0.1 % Apply 1 application topically 2 (two) times daily. 30 g Francene Finders, PA-C      PDMP not reviewed this encounter.   Francene Finders, PA-C 10/28/20 1752

## 2021-10-23 ENCOUNTER — Encounter (HOSPITAL_BASED_OUTPATIENT_CLINIC_OR_DEPARTMENT_OTHER): Payer: Self-pay

## 2021-10-23 ENCOUNTER — Emergency Department (HOSPITAL_BASED_OUTPATIENT_CLINIC_OR_DEPARTMENT_OTHER)
Admission: EM | Admit: 2021-10-23 | Discharge: 2021-10-23 | Disposition: A | Discharge status: Home / Self Care | Payer: Managed Care, Other (non HMO) | Attending: Emergency Medicine | Admitting: Emergency Medicine

## 2021-10-23 ENCOUNTER — Other Ambulatory Visit: Payer: Self-pay

## 2021-10-23 DIAGNOSIS — M79601 Pain in right arm: Secondary | ICD-10-CM | POA: Diagnosis not present

## 2021-10-23 DIAGNOSIS — B029 Zoster without complications: Secondary | ICD-10-CM | POA: Diagnosis not present

## 2021-10-23 DIAGNOSIS — R21 Rash and other nonspecific skin eruption: Secondary | ICD-10-CM | POA: Diagnosis present

## 2021-10-23 MED ORDER — OXYCODONE HCL 5 MG PO TABS: 5.0000 mg | ORAL_TABLET | ORAL | 0 refills | Status: AC | PRN

## 2021-10-23 MED ORDER — VALACYCLOVIR HCL 1 G PO TABS: 1000.0000 mg | ORAL_TABLET | Freq: Three times a day (TID) | ORAL | 0 refills | Status: AC

## 2021-10-23 NOTE — ED Triage Notes (Signed)
Right arm pain x 1 week. Describes as sharp stinging pains. Worst around elbow.

## 2021-10-23 NOTE — ED Provider Notes (Signed)
  [facility] Provider Note   CSN: [id number] Arrival date & time: 10/23/21  0604     History {Add pertinent medical, surgical, social history, OB history to HPI:1} Chief Complaint  Patient presents with   Arm Pain    John Wang is a 51 y.o. male.  HPI     Home Medications Prior to Admission medications   Medication Sig Start Date End Date Taking? Authorizing Provider  cyclobenzaprine (FLEXERIL) 10 MG tablet Take 1 tablet (10 mg total) by mouth 3 (three) times daily as needed for muscle spasms. 08/26/16   Horald Pollen, MD  guaiFENesin (MUCINEX) 600 MG 12 hr tablet Take by mouth 2 (two) times daily.    [provider]  HYDROcodone-acetaminophen (NORCO) 5-325 MG tablet Take 1 tablet by mouth every 6 (six) hours as needed for moderate pain. Patient not taking: No sig reported 04/28/16   Horald Pollen, MD  triamcinolone cream (KENALOG) 0.1 % Apply 1 application topically 2 (two) times daily. 10/28/20   Francene Finders, PA-C      Allergies    Fish allergy    Review of Systems   Review of Systems  Physical Exam Updated Vital Signs BP (!) 140/93   Pulse 82   Temp 98.6 F (37 C)   Resp 18   Ht '6\' 1"'$  (5.883 m)   Wt 77.1 kg   SpO2 93%   BMI 22.43 kg/m  Physical Exam  ED Results / Procedures / Treatments   Labs (all labs ordered are listed, but only abnormal results are displayed) Labs Reviewed - No data to display  EKG None  Radiology No results found.  Procedures Procedures  {Document cardiac monitor, telemetry assessment procedure when appropriate:1}  Medications Ordered in ED Medications - No data to display  ED Course/ Medical Decision Making/ A&P                           Medical Decision Making  ***  {Document critical care time when appropriate:1} {Document review of labs and clinical decision tools ie heart score, Chads2Vasc2 etc:1}  {Document your independent review of radiology images,  and any outside records:1} {Document your discussion with family members, caretakers, and with consultants:1} {Document social determinants of health affecting pt's care:1} {Document your decision making why or why not admission, treatments were needed:1} Final Clinical Impression(s) / ED Diagnoses Final diagnoses:  None    Rx / DC Orders ED Discharge Orders     None

## 2022-08-06 ENCOUNTER — Emergency Department (HOSPITAL_COMMUNITY): Payer: Managed Care, Other (non HMO)

## 2022-08-06 ENCOUNTER — Encounter (HOSPITAL_COMMUNITY): Payer: Self-pay | Admitting: Emergency Medicine

## 2022-08-06 ENCOUNTER — Other Ambulatory Visit: Payer: Self-pay

## 2022-08-06 ENCOUNTER — Inpatient Hospital Stay (HOSPITAL_COMMUNITY)
Admission: EM | Admit: 2022-08-06 | Discharge: 2022-08-10 | DRG: 200 | Disposition: A | Discharge status: Home / Self Care | Payer: Managed Care, Other (non HMO) | Attending: General Surgery | Admitting: General Surgery

## 2022-08-06 DIAGNOSIS — M4856XA Collapsed vertebra, not elsewhere classified, lumbar region, initial encounter for fracture: Secondary | ICD-10-CM | POA: Diagnosis present

## 2022-08-06 DIAGNOSIS — S270XXA Traumatic pneumothorax, initial encounter: Principal | ICD-10-CM | POA: Diagnosis present

## 2022-08-06 DIAGNOSIS — J939 Pneumothorax, unspecified: Secondary | ICD-10-CM

## 2022-08-06 DIAGNOSIS — Y92410 Unspecified street and highway as the place of occurrence of the external cause: Secondary | ICD-10-CM

## 2022-08-06 DIAGNOSIS — M4854XA Collapsed vertebra, not elsewhere classified, thoracic region, initial encounter for fracture: Secondary | ICD-10-CM | POA: Diagnosis present

## 2022-08-06 DIAGNOSIS — M542 Cervicalgia: Secondary | ICD-10-CM | POA: Diagnosis present

## 2022-08-06 DIAGNOSIS — S0101XA Laceration without foreign body of scalp, initial encounter: Secondary | ICD-10-CM | POA: Diagnosis present

## 2022-08-06 DIAGNOSIS — S2232XA Fracture of one rib, left side, initial encounter for closed fracture: Secondary | ICD-10-CM | POA: Diagnosis present

## 2022-08-06 DIAGNOSIS — T07XXXA Unspecified multiple injuries, initial encounter: Principal | ICD-10-CM

## 2022-08-06 DIAGNOSIS — S8011XA Contusion of right lower leg, initial encounter: Secondary | ICD-10-CM | POA: Diagnosis present

## 2022-08-06 DIAGNOSIS — I10 Essential (primary) hypertension: Secondary | ICD-10-CM | POA: Diagnosis present

## 2022-08-06 LAB — I-STAT CHEM 8, ED
BUN: 19 mg/dL (ref 6–20)
Calcium, Ion: 1.15 mmol/L (ref 1.15–1.40)
Chloride: 103 mmol/L (ref 98–111)
Creatinine, Ser: 1.1 mg/dL (ref 0.61–1.24)
Glucose, Bld: 179 mg/dL — ABNORMAL HIGH (ref 70–99)
HCT: 42 % (ref 39.0–52.0)
Hemoglobin: 14.3 g/dL (ref 13.0–17.0)
Potassium: 3.1 mmol/L — ABNORMAL LOW (ref 3.5–5.1)
Sodium: 139 mmol/L (ref 135–145)
TCO2: 25 mmol/L (ref 22–32)

## 2022-08-06 LAB — COMPREHENSIVE METABOLIC PANEL
ALT: 30 U/L (ref 0–44)
AST: 30 U/L (ref 15–41)
Albumin: 3.6 g/dL (ref 3.5–5.0)
Alkaline Phosphatase: 70 U/L (ref 38–126)
Anion gap: 9 (ref 5–15)
BUN: 18 mg/dL (ref 6–20)
CO2: 23 mmol/L (ref 22–32)
Calcium: 8.3 mg/dL — ABNORMAL LOW (ref 8.9–10.3)
Chloride: 103 mmol/L (ref 98–111)
Creatinine, Ser: 1.09 mg/dL (ref 0.61–1.24)
GFR, Estimated: >60 mL/min (ref >60)
Glucose, Bld: 182 mg/dL — ABNORMAL HIGH (ref 70–99)
Potassium: 3 mmol/L — ABNORMAL LOW (ref 3.5–5.1)
Sodium: 135 mmol/L (ref 135–145)
Total Bilirubin: 0.6 mg/dL (ref 0.3–1.2)
Total Protein: 6.8 g/dL (ref 6.5–8.1)

## 2022-08-06 LAB — CBC
HCT: 39.7 % (ref 39.0–52.0)
Hemoglobin: 12.9 g/dL — ABNORMAL LOW (ref 13.0–17.0)
MCH: 30.4 pg (ref 26.0–34.0)
MCHC: 32.5 g/dL (ref 30.0–36.0)
MCV: 93.4 fL (ref 80.0–100.0)
Platelets: 338 10*3/uL (ref 150–400)
RBC: 4.25 MIL/uL (ref 4.22–5.81)
RDW: 13.3 % (ref 11.5–15.5)
WBC: 13.5 10*3/uL — ABNORMAL HIGH (ref 4.0–10.5)
nRBC: 0 % (ref 0.0–0.2)

## 2022-08-06 LAB — LACTIC ACID, PLASMA: Lactic Acid, Venous: 1.4 mmol/L (ref 0.5–1.9)

## 2022-08-06 LAB — SAMPLE TO BLOOD BANK

## 2022-08-06 LAB — ABO/RH: ABO/RH(D): B POS

## 2022-08-06 LAB — ETHANOL: Alcohol, Ethyl (B): <10 mg/dL (ref <10)

## 2022-08-06 LAB — TYPE AND SCREEN
ABO/RH(D): B POS
Antibody Screen: NEGATIVE

## 2022-08-06 LAB — PROTIME-INR
INR: 1 (ref 0.8–1.2)
Prothrombin Time: 13.2 seconds (ref 11.4–15.2)

## 2022-08-06 MED ORDER — TETANUS-DIPHTH-ACELL PERTUSSIS 5-2.5-18.5 LF-MCG/0.5 IM SUSY: 0.5000 mL | PREFILLED_SYRINGE | Freq: Once | INTRAMUSCULAR | Status: DC

## 2022-08-06 MED ORDER — LACTATED RINGERS IV BOLUS
1000.0000 mL | Freq: Once | INTRAVENOUS | Status: AC
  Administered 2022-08-06: 1000 mL via INTRAVENOUS

## 2022-08-06 MED ORDER — IOHEXOL 350 MG/ML SOLN
50.0000 mL | Freq: Once | INTRAVENOUS | Status: AC | PRN
  Administered 2022-08-06: 50 mL via INTRAVENOUS
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
## 2022-08-06 NOTE — ED Notes (Signed)
Patient transported to CT scan . 

## 2022-08-07 ENCOUNTER — Observation Stay (HOSPITAL_COMMUNITY): Payer: Managed Care, Other (non HMO)

## 2022-08-07 LAB — CBC
HCT: 38.3 % — ABNORMAL LOW (ref 39.0–52.0)
Hemoglobin: 12.7 g/dL — ABNORMAL LOW (ref 13.0–17.0)
MCH: 31 pg (ref 26.0–34.0)
MCHC: 33.2 g/dL (ref 30.0–36.0)
MCV: 93.4 fL (ref 80.0–100.0)
Platelets: 295 10*3/uL (ref 150–400)
RBC: 4.1 MIL/uL — ABNORMAL LOW (ref 4.22–5.81)
RDW: 13.2 % (ref 11.5–15.5)
WBC: 12.5 10*3/uL — ABNORMAL HIGH (ref 4.0–10.5)
nRBC: 0 % (ref 0.0–0.2)

## 2022-08-07 LAB — BASIC METABOLIC PANEL
Anion gap: 9 (ref 5–15)
BUN: 13 mg/dL (ref 6–20)
CO2: 26 mmol/L (ref 22–32)
Calcium: 8.6 mg/dL — ABNORMAL LOW (ref 8.9–10.3)
Chloride: 102 mmol/L (ref 98–111)
Creatinine, Ser: 0.94 mg/dL (ref 0.61–1.24)
GFR, Estimated: >60 mL/min (ref >60)
Glucose, Bld: 147 mg/dL — ABNORMAL HIGH (ref 70–99)
Potassium: 3.8 mmol/L (ref 3.5–5.1)
Sodium: 137 mmol/L (ref 135–145)

## 2022-08-07 LAB — HIV ANTIBODY (ROUTINE TESTING W REFLEX): HIV Screen 4th Generation wRfx: NONREACTIVE

## 2022-08-07 LAB — RAPID URINE DRUG SCREEN, HOSP PERFORMED
Amphetamines: NOT DETECTED
Barbiturates: NOT DETECTED
Benzodiazepines: NOT DETECTED
Cocaine: POSITIVE — AB
Opiates: POSITIVE — AB
Tetrahydrocannabinol: NOT DETECTED

## 2022-08-07 MED ORDER — IOHEXOL 350 MG/ML SOLN
75.0000 mL | Freq: Once | INTRAVENOUS | Status: AC | PRN
  Administered 2022-08-07: 75 mL via INTRAVENOUS

## 2022-08-07 MED ORDER — ACETAMINOPHEN 325 MG PO TABS: 650.0000 mg | ORAL_TABLET | Freq: Four times a day (QID) | ORAL | Status: DC

## 2022-08-07 MED ORDER — LACTATED RINGERS IV SOLN: INTRAVENOUS | Status: DC

## 2022-08-07 MED ORDER — ONDANSETRON 4 MG PO TBDP: 4.0000 mg | ORAL_TABLET | Freq: Four times a day (QID) | ORAL | Status: DC | PRN

## 2022-08-07 MED ORDER — HYDRALAZINE HCL 20 MG/ML IJ SOLN: 10.0000 mg | INTRAMUSCULAR | Status: DC | PRN

## 2022-08-07 MED ORDER — DOCUSATE SODIUM 100 MG PO CAPS
100.0000 mg | ORAL_CAPSULE | Freq: Two times a day (BID) | ORAL | Status: DC
  Administered 2022-08-07 – 2022-08-10 (×6): 100 mg via ORAL
  Filled 2022-08-07 (×6): qty 1

## 2022-08-07 MED ORDER — LORAZEPAM 1 MG PO TABS: 1.0000 mg | ORAL_TABLET | ORAL | Status: AC | PRN

## 2022-08-07 MED ORDER — POLYETHYLENE GLYCOL 3350 17 G PO PACK: 17.0000 g | PACK | Freq: Every day | ORAL | Status: DC | PRN

## 2022-08-07 MED ORDER — METOPROLOL TARTRATE 5 MG/5ML IV SOLN: 5.0000 mg | Freq: Four times a day (QID) | INTRAVENOUS | Status: DC | PRN

## 2022-08-07 MED ORDER — ADULT MULTIVITAMIN W/MINERALS CH
1.0000 | ORAL_TABLET | Freq: Every day | ORAL | Status: DC
  Administered 2022-08-07 – 2022-08-10 (×4): 1 via ORAL
  Filled 2022-08-07 (×4): qty 1

## 2022-08-07 MED ORDER — THIAMINE HCL 100 MG/ML IJ SOLN: 100.0000 mg | Freq: Every day | INTRAMUSCULAR | Status: DC

## 2022-08-07 MED ORDER — FOLIC ACID 1 MG PO TABS
1.0000 mg | ORAL_TABLET | Freq: Every day | ORAL | Status: DC
  Administered 2022-08-07 – 2022-08-10 (×4): 1 mg via ORAL
  Filled 2022-08-07 (×4): qty 1

## 2022-08-07 MED ORDER — OXYCODONE HCL 5 MG PO TABS
5.0000 mg | ORAL_TABLET | ORAL | Status: DC | PRN
  Administered 2022-08-08 – 2022-08-09 (×2): 5 mg via ORAL
  Filled 2022-08-07 (×2): qty 1

## 2022-08-07 MED ORDER — THIAMINE MONONITRATE 100 MG PO TABS
100.0000 mg | ORAL_TABLET | Freq: Every day | ORAL | Status: DC
  Administered 2022-08-07 – 2022-08-10 (×4): 100 mg via ORAL
  Filled 2022-08-07 (×4): qty 1

## 2022-08-07 MED ORDER — METHOCARBAMOL 500 MG PO TABS
500.0000 mg | ORAL_TABLET | Freq: Three times a day (TID) | ORAL | Status: AC
  Administered 2022-08-07 – 2022-08-09 (×8): 500 mg via ORAL
  Filled 2022-08-07 (×8): qty 1

## 2022-08-07 MED ORDER — ONDANSETRON HCL 4 MG/2ML IJ SOLN: 4.0000 mg | Freq: Four times a day (QID) | INTRAMUSCULAR | Status: DC | PRN

## 2022-08-07 MED ORDER — METHOCARBAMOL 750 MG PO TABS: 750.0000 mg | ORAL_TABLET | Freq: Three times a day (TID) | ORAL | Status: DC

## 2022-08-07 MED ORDER — MORPHINE SULFATE (PF) 4 MG/ML IV SOLN
4.0000 mg | INTRAVENOUS | Status: DC | PRN
  Administered 2022-08-07: 4 mg via INTRAVENOUS
  Administered 2022-08-07: 2 mg via INTRAVENOUS
  Administered 2022-08-07 (×2): 4 mg via INTRAVENOUS
  Filled 2022-08-07 (×4): qty 1

## 2022-08-07 MED ORDER — METHOCARBAMOL 1000 MG/10ML IJ SOLN
500.0000 mg | Freq: Three times a day (TID) | INTRAVENOUS | Status: AC
  Administered 2022-08-07: 500 mg via INTRAVENOUS
  Filled 2022-08-07: qty 500

## 2022-08-07 MED ORDER — OXYCODONE HCL 5 MG PO TABS
10.0000 mg | ORAL_TABLET | ORAL | Status: DC | PRN
  Administered 2022-08-07 – 2022-08-08 (×2): 10 mg via ORAL
  Filled 2022-08-07 (×2): qty 2

## 2022-08-07 MED ORDER — ACETAMINOPHEN 500 MG PO TABS
1000.0000 mg | ORAL_TABLET | Freq: Four times a day (QID) | ORAL | Status: DC
  Administered 2022-08-07 – 2022-08-10 (×12): 1000 mg via ORAL
  Filled 2022-08-07 (×13): qty 2

## 2022-08-07 MED ORDER — ENOXAPARIN SODIUM 30 MG/0.3ML IJ SOSY
30.0000 mg | PREFILLED_SYRINGE | Freq: Two times a day (BID) | INTRAMUSCULAR | Status: DC
  Administered 2022-08-07 – 2022-08-10 (×6): 30 mg via SUBCUTANEOUS
  Filled 2022-08-07 (×6): qty 0.3
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
## 2022-08-07 NOTE — Progress Notes (Signed)
Trauma Event Note  Assisted Dr Andrey Campanile with bedside thoracostomy. Time out performed, consent verified. Vital signs stable throughout procedure. Chest XR placed. CT to 20 of suction, no air leak.  Last imported Vital Signs BP (!) 155/98 (BP Location: Right Arm)   Pulse 84   Temp 98.1 F (36.7 C) (Oral)   Resp 16   Ht 6\' 1"  (1.854 m)   Wt 56.7 kg   SpO2 100%   BMI 16.49 kg/m   Trending CBC Recent Labs    08/06/22 2213 08/06/22 2219 08/07/22 0817  WBC 13.5*  --  12.5*  HGB 12.9* 14.3 12.7*  HCT 39.7 42.0 38.3*  PLT 338  --  295   Trending Coag's Recent Labs    08/06/22 2213  INR 1.0   Trending BMET Recent Labs    08/06/22 2213 08/06/22 2219 08/07/22 0305  NA 135 139 137  K 3.0* 3.1* 3.8  CL 103 103 102  CO2 23  --  26  BUN 18 19 13   CREATININE 1.09 1.10 0.94  GLUCOSE 182* 179* 147*    Keiondre Colee L Ilijah Doucet  Trauma Response RN  Please call TRN at [phone] for further assistance.

## 2022-08-07 NOTE — Progress Notes (Signed)
2mg  iv morphine wasted with Tiara

## 2022-08-07 NOTE — TOC CAGE-AID Note (Signed)
Transition of Care (TOC) - CAGE-AID Screening  Patient Details  Name: John DESKIN Sr. MRN: [id number] Date of Birth: [birth year]  Clinical Narrative:  Patient endorses every day alcohol use, usually 2 shots of liquor when he gets home from work at night. Denies ever experiencing withdrawal symptoms when not drinking, and has gone days before. Endorses occasional cocaine use. Does not smoke cigarettes but smokes cigars.  CAGE-AID Screening:   Have You Ever Felt You Ought to Cut Down on Your Drinking or Drug Use?: Yes Have People Annoyed You By Critizing Your Drinking Or Drug Use?: Yes Have You Felt Bad Or Guilty About Your Drinking Or Drug Use?: No Have You Ever Had a Drink or Used Drugs First Thing In The Morning to Steady Your Nerves or to Get Rid of a Hangover?: No CAGE-AID Score: 2  Substance Abuse Education Offered: Yes  Substance abuse interventions: Referral to (must comment) (TOC)
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
## 2022-08-07 NOTE — Progress Notes (Signed)
Trauma Event Note    TRN at bedside to eval new chest tube, pt mentioned that tape on dressing on his head is itching - changed to foam. Minimal serosanguinous drainage on guaze, wound clean and dry, staples x8 intact.      Jachelle Fluty O Ansley Mangiapane  Trauma Response RN  Please call TRN at [phone] for further assistance.

## 2022-08-07 NOTE — Progress Notes (Signed)
Pt noted to have swelling in L supraclavicular are this am CTA neck performed. No vessel injury but worsening soft tissue gas and enlarging PTX  Although pt resting comfortably with nml HR and good O2 sats he reports increased swelling in neck.   Rec placing CT to evacuate PTX Discussed risk/benefits and procedure with pt and sister at [facility]. Andrey Campanile, MD, FACS General, Bariatric, & Minimally Invasive Surgery [facility],  A [facility]
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
## 2022-08-07 NOTE — Progress Notes (Signed)
Left Chest Tube Insertion Procedure Note  Indications:  Clinically significant Pneumothorax  Pre-operative Diagnosis: Pneumothorax -enlarging  Post-operative Diagnosis: same  Procedure Details  Informed consent was obtained for the procedure, including sedation.  Risks of lung perforation, hemorrhage, arrhythmia, and adverse drug reaction were discussed.  Pt received oral tylenol and 2 milligrams IV morphine.  Pt on O2 and monitor. Trauma Response Nurse at [facility]   After sterile skin prep, using standard technique, a 14 French tube was placed in the left lateral 4th rib space. +rush of air. Secured to skin with two interrupted 0 silk sutures. No air leak  Findings: +rush of air, no air leak  Estimated Blood Loss:   0         Specimens:  None              Complications:  None; patient tolerated the procedure well.         Disposition:  pt in his room; CXR pending - will have Dr Janee Morn f/u         Condition: stable  Attending Attestation: I performed the procedure.  Mary Sella. Andrey Campanile, MD, FACS General, Bariatric, & Minimally Invasive Surgery [facility],  [facility]

## 2022-08-08 ENCOUNTER — Observation Stay (HOSPITAL_COMMUNITY): Payer: Managed Care, Other (non HMO)

## 2022-08-08 DIAGNOSIS — S270XXA Traumatic pneumothorax, initial encounter: Secondary | ICD-10-CM | POA: Diagnosis present

## 2022-08-08 DIAGNOSIS — I10 Essential (primary) hypertension: Secondary | ICD-10-CM | POA: Diagnosis present

## 2022-08-08 DIAGNOSIS — Y92410 Unspecified street and highway as the place of occurrence of the external cause: Secondary | ICD-10-CM | POA: Diagnosis not present

## 2022-08-08 DIAGNOSIS — S0101XA Laceration without foreign body of scalp, initial encounter: Secondary | ICD-10-CM | POA: Diagnosis present

## 2022-08-08 DIAGNOSIS — M4856XA Collapsed vertebra, not elsewhere classified, lumbar region, initial encounter for fracture: Secondary | ICD-10-CM | POA: Diagnosis present

## 2022-08-08 DIAGNOSIS — J939 Pneumothorax, unspecified: Secondary | ICD-10-CM | POA: Diagnosis present

## 2022-08-08 DIAGNOSIS — S2232XA Fracture of one rib, left side, initial encounter for closed fracture: Secondary | ICD-10-CM | POA: Diagnosis present

## 2022-08-08 DIAGNOSIS — M542 Cervicalgia: Secondary | ICD-10-CM | POA: Diagnosis present

## 2022-08-08 DIAGNOSIS — S8011XA Contusion of right lower leg, initial encounter: Secondary | ICD-10-CM | POA: Diagnosis present

## 2022-08-08 DIAGNOSIS — M4854XA Collapsed vertebra, not elsewhere classified, thoracic region, initial encounter for fracture: Secondary | ICD-10-CM | POA: Diagnosis present

## 2022-08-08 MED ORDER — TAMSULOSIN HCL 0.4 MG PO CAPS
0.4000 mg | ORAL_CAPSULE | Freq: Every day | ORAL | Status: DC
  Administered 2022-08-08 – 2022-08-10 (×3): 0.4 mg via ORAL
  Filled 2022-08-08 (×3): qty 1
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
## 2022-08-09 ENCOUNTER — Inpatient Hospital Stay (HOSPITAL_COMMUNITY): Payer: Managed Care, Other (non HMO)

## 2022-08-09 NOTE — Progress Notes (Signed)
Mobility Specialist Progress Note:    08/09/22 1541  Mobility  Activity Ambulated independently in hallway  Level of Assistance Modified independent, requires aide device or extra time  Assistive Device None  Distance Ambulated (ft) 200 ft  Activity Response Tolerated well  Mobility Referral Yes  $Mobility charge 1 Mobility  Mobility Specialist Start Time (ACUTE ONLY) 1416  Mobility Specialist Stop Time (ACUTE ONLY) 1426  Mobility Specialist Time Calculation (min) (ACUTE ONLY) 10 min   Received pt in bed having no complaints and agreeable to mobility. Pt was asymptomatic throughout ambulation and returned to room w/o fault. Left in bed w/ call bell in reach and all needs met.   Thompson Grayer Mobility Specialist  Please contact vis Secure Chat or  Rehab Office [phone]
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
## 2022-08-10 ENCOUNTER — Inpatient Hospital Stay (HOSPITAL_COMMUNITY): Payer: Managed Care, Other (non HMO)

## 2022-08-10 MED ORDER — OXYCODONE HCL 5 MG PO TABS: 5.0000 mg | ORAL_TABLET | ORAL | 0 refills | Status: AC | PRN

## 2022-08-10 MED ORDER — ACETAMINOPHEN 500 MG PO TABS: 1000.0000 mg | ORAL_TABLET | Freq: Four times a day (QID) | ORAL | Status: AC | PRN

## 2022-08-10 NOTE — Progress Notes (Signed)
Chest tube removed without difficulty.  Appropriate bandage placed.  CXR around 1500.  If stable, will DC home.  Work note provided for patient as well.  John Wang 08/10/2022
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
## 2022-08-11 ENCOUNTER — Encounter (HOSPITAL_BASED_OUTPATIENT_CLINIC_OR_DEPARTMENT_OTHER): Payer: Self-pay

## 2022-08-24 ENCOUNTER — Ambulatory Visit
Admission: RE | Admit: 2022-08-24 | Discharge: 2022-08-24 | Disposition: A | Discharge status: Home / Self Care | Payer: Managed Care, Other (non HMO) | Source: Ambulatory Visit | Attending: General Surgery | Admitting: General Surgery

## 2022-08-24 DIAGNOSIS — J939 Pneumothorax, unspecified: Secondary | ICD-10-CM

## 2022-08-25 ENCOUNTER — Telehealth: Payer: Self-pay | Admitting: General Surgery

## 2022-08-25 NOTE — Telephone Encounter (Signed)
Called patient today to discuss his follow up CXR that he obtained yesterday after his PTX in the hospital.  He is doing well with no SOB or chest pain.  He is doing his normal ADLs with no issues, except what he is unable to do due to his NSGY restrictions.  His CXR has been reviewed by myself as well as Dr. Janee Morn.  No acute findings were noted, no PTX, no effusion, etc.  Patient expressed understanding and appreciation.  He is told to follow up prn.  Letha Cape 11:14 AM 08/25/2022

## 2022-12-08 DIAGNOSIS — L309 Dermatitis, unspecified: Secondary | ICD-10-CM | POA: Insufficient documentation

## 2022-12-08 DIAGNOSIS — L709 Acne, unspecified: Secondary | ICD-10-CM | POA: Insufficient documentation

## 2023-02-11 ENCOUNTER — Encounter: Payer: Self-pay | Admitting: Internal Medicine

## 2023-02-25 ENCOUNTER — Ambulatory Visit (AMBULATORY_SURGERY_CENTER): Payer: Managed Care, Other (non HMO) | Admitting: *Deleted

## 2023-02-25 VITALS — Ht 74.0 in | Wt 175.0 lb

## 2023-02-25 DIAGNOSIS — Z1211 Encounter for screening for malignant neoplasm of colon: Secondary | ICD-10-CM

## 2023-02-25 MED ORDER — SUFLAVE 178.7 G PO SOLR: 1.0000 | Freq: Once | ORAL | 0 refills | Status: AC

## 2023-02-25 MED ORDER — SUFLAVE 178.7 G PO SOLR: 1.0000 | Freq: Once | ORAL | 0 refills | Status: DC

## 2023-02-25 NOTE — Progress Notes (Signed)
Pt's name and DOB verified at the beginning of the pre-visit wit 2 identifiers  Pt denies any difficulty with ambulating,sitting, laying down or rolling side to side  Pt has no issues with ambulation   Pt has no issues moving head neck or swallowing  No egg or soy allergy known to patient   No issues known to pt with past sedation with any surgeries or procedures  Pt denies having issues being intubated  Patient denies ever being intubated  No FH of Malignant Hyperthermia  Pt is not on diet pills or shots  Pt is not on home 02   Pt is not on blood thinners   Pt denies issues with constipation   Pt is not on dialysis  Pt denise any abnormal heart rhythms   Pt denies any upcoming cardiac testing  Pt encouraged to use to use Singlecare or Goodrx to reduce cost   Patient's chart reviewed by Cathlyn Parsons CNRA prior to pre-visit and patient appropriate for the [facility].  Pre-visit completed and red dot placed by patient's name on their procedure day (on provider's schedule).  .  Visit by phone  Pt states weight is 175 lb  Instructed pt why it is important to and  to call if they have any changes in health or new medications. Directed them to the # given and on instructions.     Instructions reviewed. Pt given both [facility] main # and MD on call # and [facility] # with prior to instructions.  Pt states understanding. Instructed to review again prior to procedure. Pt states they will.   Informed pt that they will receive a text and  call from [facility] regarding there prep med.

## 2023-02-25 NOTE — Addendum Note (Signed)
Addended by: Danton Sewer on: 02/25/2023 04:42 PM   Modules accepted: Orders

## 2023-02-25 NOTE — Addendum Note (Signed)
Addended by: Danton Sewer on: 02/25/2023 04:47 PM   Modules accepted: Orders

## 2023-03-17 ENCOUNTER — Encounter: Payer: Self-pay | Admitting: Internal Medicine

## 2023-03-21 ENCOUNTER — Encounter: Payer: Self-pay | Admitting: Internal Medicine

## 2023-03-21 ENCOUNTER — Ambulatory Visit (AMBULATORY_SURGERY_CENTER): Payer: Managed Care, Other (non HMO) | Admitting: Internal Medicine

## 2023-03-21 VITALS — BP 129/98 | HR 75 | Temp 97.9°F | Resp 13 | Ht 74.0 in | Wt 175.0 lb

## 2023-03-21 DIAGNOSIS — K5730 Diverticulosis of large intestine without perforation or abscess without bleeding: Secondary | ICD-10-CM

## 2023-03-21 DIAGNOSIS — Z1211 Encounter for screening for malignant neoplasm of colon: Secondary | ICD-10-CM | POA: Diagnosis present

## 2023-03-21 DIAGNOSIS — K648 Other hemorrhoids: Secondary | ICD-10-CM

## 2023-03-21 MED ORDER — SODIUM CHLORIDE 0.9 % IV SOLN: 500.0000 mL | INTRAVENOUS | Status: DC

## 2023-03-21 NOTE — Patient Instructions (Signed)
 Educational handout provided to patient related to Hemorrhoids and Diverticulosis  Resume previous diet  Continue present medications  Repeat colonoscopy in 10 years for surveillance purposes   YOU HAD AN ENDOSCOPIC PROCEDURE TODAY AT THE [facility]:   Refer to the procedure report that was given to you for any specific questions about what was found during the examination.  If the procedure report does not answer your questions, please call your gastroenterologist to clarify.  If you requested that your care partner not be given the details of your procedure findings, then the procedure report has been included in a sealed envelope for you to review at your convenience later.  YOU SHOULD EXPECT: Some feelings of bloating in the abdomen. Passage of more gas than usual.  Walking can help get rid of the air that was put into your GI tract during the procedure and reduce the bloating. If you had a lower endoscopy (such as a colonoscopy or flexible sigmoidoscopy) you may notice spotting of blood in your stool or on the toilet paper. If you underwent a bowel prep for your procedure, you may not have a normal bowel movement for a few days.  Please Note:  You might notice some irritation and congestion in your nose or some drainage.  This is from the oxygen used during your procedure.  There is no need for concern and it should clear up in a day or so.  SYMPTOMS TO REPORT IMMEDIATELY:  Following lower endoscopy (colonoscopy or flexible sigmoidoscopy):  Excessive amounts of blood in the stool  Significant tenderness or worsening of abdominal pains  Swelling of the abdomen that is new, acute  Fever of 100F or higher  For urgent or emergent issues, a gastroenterologist can be reached at any hour by calling [phone]. Do not use MyChart messaging for urgent concerns.    DIET:  We do recommend a small meal at first, but then you may proceed to your regular diet.  Drink plenty of  fluids but you should avoid alcoholic beverages for 24 hours.  ACTIVITY:  You should plan to take it easy for the rest of today and you should NOT DRIVE or use heavy machinery until tomorrow (because of the sedation medicines used during the test).    FOLLOW UP: Our staff will call the number listed on your records the next business day following your procedure.  We will call around 7:15- 8:00 am to check on you and address any questions or concerns that you may have regarding the information given to you following your procedure. If we do not reach you, we will leave a message.     If any biopsies were taken you will be contacted by phone or by letter within the next 1-3 weeks.  Please call us at [phone] if you have not heard about the biopsies in 3 weeks.    SIGNATURES/CONFIDENTIALITY: You and/or your care partner have signed paperwork which will be entered into your electronic medical record.  These signatures attest to the fact that that the information above on your After Visit Summary has been reviewed and is understood.  Full responsibility of the confidentiality of this discharge information lies with you and/or your care-partner.

## 2023-03-21 NOTE — Op Note (Signed)
 [facility] Patient Name: John Wang Procedure Date: 03/21/2023 8:55 AM MRN: [id number] Endoscopist: Wilhemina Bonito. Marina Goodell , MD, [id number] Age: 53 Referring MD:  Date of Birth: December 10, 1970 Gender: Male Account #: [id number] Procedure:                Colonoscopy Indications:              Screening for colorectal malignant neoplasm Medicines:                Monitored Anesthesia Care Procedure:                Pre-Anesthesia Assessment:                           - Prior to the procedure, a History and Physical                            was performed, and patient medications and                            allergies were reviewed. The patient's tolerance of                            previous anesthesia was also reviewed. The risks                            and benefits of the procedure and the sedation                            options and risks were discussed with the patient.                            All questions were answered, and informed consent                            was obtained. Prior Anticoagulants: The patient has                            taken no anticoagulant or antiplatelet agents. ASA                            Grade Assessment: II - A patient with mild systemic                            disease. After reviewing the risks and benefits,                            the patient was deemed in satisfactory condition to                            undergo the procedure.                           After obtaining informed consent, the colonoscope  was passed under direct vision. Throughout the                            procedure, the patient's blood pressure, pulse, and                            oxygen saturations were monitored continuously. The                            Olympus CF-HQ190L ([id number]) Colonoscope was                            introduced through the anus and advanced to the the                            cecum, identified by  appendiceal orifice and                            ileocecal valve. The ileocecal valve, appendiceal                            orifice, and rectum were photographed. The quality                            of the bowel preparation was excellent. The                            colonoscopy was performed without difficulty. The                            patient tolerated the procedure well. The bowel                            preparation used was SUPREP via split dose                            instruction. Scope In: 9:03:11 AM Scope Out: 9:17:29 AM Scope Withdrawal Time: 0 hours 8 minutes 28 seconds  Total Procedure Duration: 0 hours 14 minutes 18 seconds  Findings:                 Multiple diverticula were found in the entire colon.                           Internal hemorrhoids were found during retroflexion.                           The exam was otherwise without abnormality on                            direct and retroflexion views. Complications:            No immediate complications. Estimated blood loss:  None. Estimated Blood Loss:     Estimated blood loss: none. Impression:               - Diverticulosis in the entire examined colon.                           - Internal hemorrhoids.                           - The examination was otherwise normal on direct                            and retroflexion views.                           - No specimens collected. Recommendation:           - Repeat colonoscopy in 10 years for screening                            purposes.                           - Patient has a contact number available for                            emergencies. The signs and symptoms of potential                            delayed complications were discussed with the                            patient. Return to normal activities tomorrow.                            Written discharge instructions were provided to the                             patient.                           - Resume previous diet.                           - Continue present medications. Wilhemina Bonito. Marina Goodell, MD 03/21/2023 9:20:40 AM This report has been signed electronically.

## 2023-03-21 NOTE — Progress Notes (Signed)
 Pt resting comfortably. VSS. Airway intact. SBAR complete to RN. All questions answered.
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
## 2023-03-22 ENCOUNTER — Telehealth: Payer: Self-pay | Admitting: *Deleted

## 2023-03-22 NOTE — Telephone Encounter (Signed)
 Post procedure follow up call placed, no answer and left VM.
